# Patient Record
Sex: Male | Born: 1945 | Race: White | Hispanic: No | Marital: Married | Smoking: Current every day smoker
Health system: Southern US, Community
[De-identification: ages and names within clinical notes are randomized; demographics above are authoritative.]

## PROBLEM LIST (undated history)

## (undated) DIAGNOSIS — Z720 Tobacco use: Secondary | ICD-10-CM

## (undated) DIAGNOSIS — R0602 Shortness of breath: Secondary | ICD-10-CM

## (undated) DIAGNOSIS — G8929 Other chronic pain: Secondary | ICD-10-CM

## (undated) DIAGNOSIS — M25559 Pain in unspecified hip: Secondary | ICD-10-CM

## (undated) DIAGNOSIS — D649 Anemia, unspecified: Secondary | ICD-10-CM

## (undated) DIAGNOSIS — J449 Chronic obstructive pulmonary disease, unspecified: Secondary | ICD-10-CM

## (undated) DIAGNOSIS — E7800 Pure hypercholesterolemia, unspecified: Secondary | ICD-10-CM

## (undated) DIAGNOSIS — M1990 Unspecified osteoarthritis, unspecified site: Secondary | ICD-10-CM

## (undated) DIAGNOSIS — I10 Essential (primary) hypertension: Secondary | ICD-10-CM

## (undated) HISTORY — DX: Pure hypercholesterolemia, unspecified: E78.00

## (undated) HISTORY — DX: Pain in unspecified hip: M25.559

## (undated) HISTORY — PX: HIP SURGERY: SHX245

## (undated) HISTORY — PX: OTHER SURGICAL HISTORY: SHX169

## (undated) HISTORY — DX: Essential (primary) hypertension: I10

---

## 2004-11-29 ENCOUNTER — Emergency Department (HOSPITAL_COMMUNITY): Admission: EM | Admit: 2004-11-29 | Discharge: 2004-11-29 | Payer: Self-pay | Admitting: Emergency Medicine

## 2004-12-02 ENCOUNTER — Emergency Department (HOSPITAL_COMMUNITY): Admission: EM | Admit: 2004-12-02 | Discharge: 2004-12-02 | Payer: Self-pay | Admitting: Emergency Medicine

## 2004-12-27 ENCOUNTER — Ambulatory Visit (HOSPITAL_COMMUNITY): Admission: RE | Admit: 2004-12-27 | Discharge: 2004-12-27 | Payer: Self-pay | Admitting: Family Medicine

## 2005-02-08 ENCOUNTER — Ambulatory Visit (HOSPITAL_COMMUNITY): Admission: RE | Admit: 2005-02-08 | Discharge: 2005-02-08 | Payer: Self-pay | Admitting: Family Medicine

## 2005-08-24 ENCOUNTER — Encounter (HOSPITAL_COMMUNITY): Admission: RE | Admit: 2005-08-24 | Discharge: 2005-09-23 | Payer: Self-pay | Admitting: Oncology

## 2005-08-24 ENCOUNTER — Ambulatory Visit (HOSPITAL_COMMUNITY): Payer: Self-pay | Admitting: Oncology

## 2005-08-24 ENCOUNTER — Encounter: Admission: RE | Admit: 2005-08-24 | Discharge: 2005-08-24 | Payer: Self-pay | Admitting: Oncology

## 2006-01-02 ENCOUNTER — Ambulatory Visit (HOSPITAL_COMMUNITY): Payer: Self-pay | Admitting: Oncology

## 2006-01-02 ENCOUNTER — Encounter: Admission: RE | Admit: 2006-01-02 | Discharge: 2006-01-02 | Payer: Self-pay | Admitting: Oncology

## 2006-01-02 ENCOUNTER — Encounter (HOSPITAL_COMMUNITY): Admission: RE | Admit: 2006-01-02 | Discharge: 2006-02-01 | Payer: Self-pay | Admitting: Oncology

## 2006-03-08 ENCOUNTER — Encounter: Admission: RE | Admit: 2006-03-08 | Discharge: 2006-03-11 | Payer: Self-pay | Admitting: Oncology

## 2006-03-08 ENCOUNTER — Ambulatory Visit (HOSPITAL_COMMUNITY): Payer: Self-pay | Admitting: Oncology

## 2006-03-08 ENCOUNTER — Encounter (HOSPITAL_COMMUNITY): Admission: RE | Admit: 2006-03-08 | Discharge: 2006-03-11 | Payer: Self-pay | Admitting: Oncology

## 2006-03-14 ENCOUNTER — Encounter (HOSPITAL_COMMUNITY): Admission: RE | Admit: 2006-03-14 | Discharge: 2006-04-13 | Payer: Self-pay | Admitting: Oncology

## 2006-03-14 ENCOUNTER — Encounter: Admission: RE | Admit: 2006-03-14 | Discharge: 2006-03-14 | Payer: Self-pay | Admitting: Oncology

## 2006-09-04 ENCOUNTER — Encounter (HOSPITAL_COMMUNITY): Admission: RE | Admit: 2006-09-04 | Discharge: 2006-10-04 | Payer: Self-pay | Admitting: Oncology

## 2006-09-04 ENCOUNTER — Ambulatory Visit (HOSPITAL_COMMUNITY): Payer: Self-pay | Admitting: Oncology

## 2007-03-19 ENCOUNTER — Ambulatory Visit (HOSPITAL_COMMUNITY): Payer: Self-pay | Admitting: Oncology

## 2007-03-19 ENCOUNTER — Encounter (HOSPITAL_COMMUNITY): Admission: RE | Admit: 2007-03-19 | Discharge: 2007-04-18 | Payer: Self-pay | Admitting: Oncology

## 2011-03-24 LAB — CBC
Hemoglobin: 14.8
MCHC: 34.4
MCV: 95.8
RBC: 4.48
RDW: 13.2

## 2011-03-24 LAB — PSA: PSA: 0.89

## 2011-06-14 HISTORY — PX: JOINT REPLACEMENT: SHX530

## 2011-12-20 ENCOUNTER — Ambulatory Visit (HOSPITAL_COMMUNITY)
Admission: RE | Admit: 2011-12-20 | Discharge: 2011-12-20 | Disposition: A | Discharge status: Home / Self Care | Payer: Medicare Other | Source: Ambulatory Visit | Attending: Family Medicine | Admitting: Family Medicine

## 2011-12-20 ENCOUNTER — Other Ambulatory Visit (HOSPITAL_COMMUNITY): Payer: Self-pay | Admitting: Family Medicine

## 2011-12-20 DIAGNOSIS — M1990 Unspecified osteoarthritis, unspecified site: Secondary | ICD-10-CM

## 2011-12-20 DIAGNOSIS — M1610 Unilateral primary osteoarthritis, unspecified hip: Secondary | ICD-10-CM | POA: Insufficient documentation

## 2011-12-20 DIAGNOSIS — R937 Abnormal findings on diagnostic imaging of other parts of musculoskeletal system: Secondary | ICD-10-CM | POA: Insufficient documentation

## 2011-12-20 DIAGNOSIS — M25559 Pain in unspecified hip: Secondary | ICD-10-CM | POA: Insufficient documentation

## 2011-12-20 DIAGNOSIS — M169 Osteoarthritis of hip, unspecified: Secondary | ICD-10-CM | POA: Insufficient documentation

## 2011-12-20 LAB — CBC
AST: 13 U/L
Alkaline Phosphatase: 83 U/L
BUN: 16 mg/dL (ref 4–21)
Creat: 1.15
HCT: 45 %
MCHC: 35
MCV: 91.8 fL
PSA: 0.91
Potassium: 4.4 mmol/L
RDW: 13.9

## 2011-12-27 ENCOUNTER — Other Ambulatory Visit (HOSPITAL_COMMUNITY): Payer: Self-pay | Admitting: Family Medicine

## 2011-12-27 DIAGNOSIS — M1990 Unspecified osteoarthritis, unspecified site: Secondary | ICD-10-CM

## 2011-12-30 ENCOUNTER — Ambulatory Visit (HOSPITAL_COMMUNITY): Payer: Medicare Other

## 2011-12-30 ENCOUNTER — Ambulatory Visit (HOSPITAL_COMMUNITY)
Admission: RE | Admit: 2011-12-30 | Discharge: 2011-12-30 | Disposition: A | Discharge status: Home / Self Care | Payer: Medicare Other | Source: Ambulatory Visit | Attending: Family Medicine | Admitting: Family Medicine

## 2011-12-30 DIAGNOSIS — M1990 Unspecified osteoarthritis, unspecified site: Secondary | ICD-10-CM

## 2011-12-30 DIAGNOSIS — M169 Osteoarthritis of hip, unspecified: Secondary | ICD-10-CM | POA: Insufficient documentation

## 2011-12-30 DIAGNOSIS — R937 Abnormal findings on diagnostic imaging of other parts of musculoskeletal system: Secondary | ICD-10-CM | POA: Insufficient documentation

## 2011-12-30 DIAGNOSIS — M87059 Idiopathic aseptic necrosis of unspecified femur: Secondary | ICD-10-CM | POA: Insufficient documentation

## 2011-12-30 DIAGNOSIS — M1610 Unilateral primary osteoarthritis, unspecified hip: Secondary | ICD-10-CM | POA: Insufficient documentation

## 2011-12-30 DIAGNOSIS — M25559 Pain in unspecified hip: Secondary | ICD-10-CM | POA: Insufficient documentation

## 2012-01-25 ENCOUNTER — Encounter: Payer: Self-pay | Admitting: Orthopedic Surgery

## 2012-01-25 ENCOUNTER — Ambulatory Visit (INDEPENDENT_AMBULATORY_CARE_PROVIDER_SITE_OTHER): Payer: Medicare Other | Admitting: Orthopedic Surgery

## 2012-01-25 VITALS — BP 130/70 | Ht 71.0 in | Wt 122.0 lb

## 2012-01-25 DIAGNOSIS — M8700 Idiopathic aseptic necrosis of unspecified bone: Secondary | ICD-10-CM | POA: Insufficient documentation

## 2012-01-25 DIAGNOSIS — M169 Osteoarthritis of hip, unspecified: Secondary | ICD-10-CM

## 2012-01-25 NOTE — Progress Notes (Signed)
Subjective:    Patient ID: Terry Herman, male    DOB: 03-Dec-1945, 66 y.o.   MRN: [id number]  Hip Pain  Incident onset: 6 years pain left hip and right hip left greater than right. There was no injury mechanism. The pain is present in the left hip and right hip. The quality of the pain is described as aching and stabbing. The pain is at a severity of 8/10. The pain has been constant since onset. Associated symptoms comments: Pain sitting and walking. Symptoms started gradually and progressed over the last 6 years left side is worse than right. Current treatment naproxen and Percocet for pain. The patient has not currently drinking he stopped in 1998 but was a heavy drinker prior to that..   The patient indicates that he is having functional deficits and pain requiring heavy pain medication.   Review of Systems Positive findings on review of systems include fatigue blurred vision chest palpitation shortness of breath wheezing cough tightness of the chest heartburn diarrhea joint pain swelling instability stiffness numbness tingling unsteady gait easy bleeding easy bruising and seasonal allergies. Denies frequency skin changes nervousness or excessive thirst    Objective:   Physical Exam  Constitutional: He is oriented to person, place, and time. He appears well-developed and well-nourished.       Severely ectomorphic body habitus  HENT:  Head: Normocephalic.  Eyes: Right eye exhibits no discharge. Left eye exhibits no discharge.  Neck: Neck supple. No JVD present. No tracheal deviation present.  Cardiovascular: Intact distal pulses.   Pulmonary/Chest: Effort normal.  Abdominal: He exhibits no distension.  Musculoskeletal:       Upper extremity exam  Inspection and palpation revealed no abnormalities in the upper extremities.  Range of motion is full without contracture.  Motor exam is normal with grade 5 strength.  The joints are fully reduced without subluxation.  There is no atrophy  or tremor and muscle tone is normal.  All joints are stable.       Lymphadenopathy:    He has no cervical adenopathy.  Neurological: He is alert and oriented to person, place, and time. He has normal reflexes. He exhibits normal muscle tone. Coordination normal.  Skin: Skin is warm and dry.  Psychiatric: He has a normal mood and affect. His behavior is normal. Judgment and thought content normal.   Right Hip Exam   Tenderness  The patient is experiencing no tenderness.     Range of Motion  Extension: abnormal  Flexion: abnormal  Internal Rotation: abnormal  External Rotation: abnormal  Abduction: abnormal  Adduction: abnormal   Muscle Strength  The patient has normal right hip strength.  Other  Erythema: absent Scars: absent Sensation: normal Pulse: present  Comments:  Right lower extremity decreased range of motion with tight hip flexor. Decreased abduction. Flexion past 90.OBLIGATORY external rotation with hip flexion. Decreased internal rotation and painful range of motion.   Left Hip Exam   Tenderness  The patient is experiencing no tenderness.     Range of Motion  Extension: abnormal  Flexion: abnormal  Internal Rotation: abnormal  External Rotation: abnormal  Abduction: abnormal  Adduction: abnormal   Muscle Strength  The patient has normal left hip strength.   Other  Erythema: absent Scars: absent Sensation: normal Pulse: present  Comments:  Left hip flexion past 90 of OBLIGATORY external rotation with hip flexion, decreased abduction. Tight hip flexor. Painful range of motion.     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 I've given him some information regarding hip replacement he will read it come back in 3 weeks to schedule and discuss surgery.

## 2012-01-25 NOTE — Patient Instructions (Addendum)
Total Hip Replacement In total hip replacement, the damaged hip is replaced with an artificial hip joint (prosthesis). The purpose of this surgery is to reduce pain and improve your range of motion. It is one of the most successful joint replacement surgeries. LET YOUR CAREGIVER KNOW ABOUT:    Allergies.   Medicines taken, including herbs, eyedrops, over-the-counter medicines, and creams.   Use of steroids (by mouth or creams).   Previous problems with anesthetics.   Family history of anesthetic problems.   Possibility of pregnancy, if this applies.   History of blood clots (thrombophlebitis).   History of bleeding or blood problems.   Previous surgery.   Other health problems.  BEFORE THE PROCEDURE    Do not eat or drink anything for as long as directed by your caregiver before surgery.   You should be present 60 minutes before your procedure or as directed by your caregiver.  PROCEDURE An intravenous (IV) line for giving fluids will be started. You will be given medicines and gas to make you sleep (anesthetic), or you will be given medicines through a needle in your back to make you numb from the waist down. Your surgeon will take out any damaged cartilage and bone. He or she will then put in new metal, plastic, or ceramic joint surfaces to restore alignment and function to your hip. AFTER THE PROCEDURE   After the procedure, you will be taken to the recovery area where a nurse will watch and check your progress. You may have a long, narrow tube (catheter) in your bladder after surgery. The catheter helps you empty your bladder (pass your urine). Once you are awake, stable, and taking fluids well, you will be returned to your room. You will receive physical therapy until you are doing well and your caregiver feels it is safe for you to go home. If you do not have help at home, you may need to go to an extended care facility for 5 to 14 days after the procedure. Document Released:  09/05/2000 Document Revised: 05/19/2011 Document Reviewed: 04/01/2009 [facility] Patient Information 2012 [place], [place].Preparing for Hip Replacement Surgery Recovery from hip replacement surgery can be made easier and more comfortable by taking steps to be prepared before surgery. This includes:    Arranging for others to help you.   Preparing your home.   Making sure your body is prepared by doing a pre-operative exam and being as healthy as you can.   Doing exercises before your surgery as told by your caregiver.  Also, you can ease any concerns about your financial responsibilities by calling your insurance company after you decide to have surgery. In addition to your surgery and hospital stay, you will want to ask about your coverage for medical equipment, rehab facilities, and home care. ARRANGING FOR HELP   You will be getting stronger and more mobile every day. However, in the first couple weeks after surgery, it is unlikely you will be able to do all your daily activities as easily as before your surgery. You will tire easily and still have limited movement in your leg. Follow these guidelines to best arrange for the help you may need after your surgery:    Arrange for someone to drive you home from the hospital.  Your surgeon will be able to tell you how many days you can expect to be in the hospital.   Cancel all work, care-giving, and volunteer responsibilities for at least 4 to 6 weeks after your surgery.  If you live alone, arrange for someone to care for your home and pets for the first 4 to 6 weeks.   Select someone with whom you feel comfortable to be with you day and night for the first week. This person will help you with your exercises and personal care, like bathing and using the toilet.   Arrange for drivers to bring you to and from your doctor and therapy appointments, as well as to the grocery store and other places you need to go, for at least 4 to 6 weeks.   Select  2 or 3 rehab facilities where you would be comfortable recovering just in case you are not able to go directly home to recover.  PREPARING YOUR HOME    Remove all clutter from your floors.   To see if you will be able to move in these spaces with a wheeled walker, hold your hands out about 6 inches from your sides. Then walk from your bed to the bathroom. Walk from your resting spot to your kitchen and bathroom. If you do not hit anything with your hands, you probably have enough room.   Remove throw rugs.   Move the items you use often to shelves and drawers that are at countertop height. Move items in your bathroom, kitchen, and bedroom.   Prepare a few meals that you can freeze and reheat later.   Do not plan on recovering in bed. It is better for your health to sit upright.  You may wish to use a recliner with a small table nearby. Keep the items you use most frequently on that table. These may include the TV remote, a cordless phone, a book or laptop computer, water glass, and any other items of your choice.   Consider adding grab bars in the shower and near the toilet.   While you are in the hospital, you will learn about equipment helpful for your recovery. Some of the equipment includes raised toilet seats, tub benches, and shower benches. Often, your hospital care team will help you decide what you need and can direct you about where to buy these items. You may not need all of these items, and they are not often returnable, so it is not recommended that you buy them before going to the hospital.  PREPARING YOUR BODY    Complete a pre-operative exam. This will ensure that your body is healthy enough to safely complete this surgery. Be certain to bring a complete list of all your medicines and supplements (herbs and vitamins). You may be advised to have additional tests to ensure your safety.   Complete elective dental care and routine cleanings before the surgery. Germs from anywhere in  your body, including those in your mouth, can travel to your new joint and infect it. It is important not to have any dental work performed for at least 3 months after your surgery. After surgery, be sure to tell your dentist about your joint replacement.   Maintain a healthy diet. Unless advised by your surgeon, do not drastically change your diet before your surgery.   Quit smoking. Get help from your caregiver if you need it.   The day before your surgery, follow your surgeon's directions for showering, eating, and drinking. These directions are for your safety.  EXERCISES Your caregiver may have you do the following exercises before your surgery. Be sure to follow the exercise program your caregiver prescribes for you. Doing the exercises on both sides will  help prepare your "good" side as well. While completing these exercises, remember:    Stretch as long as you can, up to 30 seconds, without pain developing.   You should only feel a gentle lengthening or release in the stretched tissue.  Ankle Pumps 1. While sitting with your knees straight, draw the top of your feet upwards by flexing your ankles.   2. Then, reverse the motion, pointing your toes downward.  3. Repeat 10 to 20 times. Complete this exercise 1 to 2 times per day.  Heel Slides 1. Lie on your back with both knees straight. (If this causes back discomfort, bend your opposite knee, placing your foot flat on the floor.)  2. Slowly slide your heel back toward your buttocks until you feel a gentle stretch in the front of your knee or thigh.  3. Slowly slide your heel back to the starting position.  4. Repeat 10 to 20 times. Complete this exercise 1 to 2 times per day.  Quadriceps Sets 1. Lie on your back with your sore leg extended and your opposite knee bent.  2. Gradually tense the muscles in the front of your thigh. You should see either your kneecap slide up toward your hip or increased dimpling just above the knee. This  motion will push the back of the knee down toward the floor.  3. Hold the muscle as tight as you can without increasing your pain for 10 seconds.  4. Relax the muscles slowly and completely in between each repetition.  5. Repeat 10 to 20 times. Complete this exercise 1 to 2 times per day.  Short Arc Kicks  1. Lie on your back. Place a 4 to 6 inch towel roll under your sore knee so that the knee slightly bends.  2. Raise only your lower leg by tightening the muscles in the front of your thigh. Do not allow your thigh to rise.  3. Hold this position for 5 seconds.  4. Repeat 10 to 20 times. Complete this exercise 1 to 2 times per day.  Straight Leg Raises 1. Lie on your back with your sore leg extended and your opposite knee bent.   2. Tense the muscles in the front of your sore thigh. You should see either your kneecap slide up or increased dimpling just above the knee. Your thigh may even quiver.  3. Tighten these muscles even more and raise your leg 4 to 6 inches off the floor. Hold for 3 to 5 seconds.  4. Keeping these muscles tense, lower your leg.  5. Relax the muscles slowly and completely in between each repetition.  6. Repeat 10 to 20 times. Complete this exercise 1 to 2 times per day.  Arm Chair Push-ups 1. Find a firm, non-wheeled chair with solid armrests.  2. Sitting in the chair, extend your sore leg straight out in front of you.  3. Lift up your body weight, using your arms and opposite leg.  4. Slowly lower your body weight.   5. Repeat 10 to 20 times. Complete this exercise 1 to 2 times per day.  Document Released: 09/03/2010 Document Revised: 05/19/2011 Document Reviewed: 09/03/2010 [facility] Patient Information 2012 [place], [place].

## 2012-02-15 ENCOUNTER — Encounter: Payer: Self-pay | Admitting: Orthopedic Surgery

## 2012-02-15 ENCOUNTER — Ambulatory Visit (INDEPENDENT_AMBULATORY_CARE_PROVIDER_SITE_OTHER): Payer: Medicare Other | Admitting: Orthopedic Surgery

## 2012-02-15 VITALS — BP 110/50 | Ht 71.0 in | Wt 122.0 lb

## 2012-02-15 DIAGNOSIS — M8700 Idiopathic aseptic necrosis of unspecified bone: Secondary | ICD-10-CM

## 2012-02-15 NOTE — Progress Notes (Signed)
Patient ID: Terry Herman, male   DOB: 1945-11-25, 66 y.o.   MRN: [id number] Chief Complaint  Patient presents with  . Follow-up    follow up hip pain, left greater than right, discuss surgery   Surgery will proceed as planned with a LEFT total hip arthroplasty.  However, he will need some dental work done prior to surgery. He will call us and I will speak to the [facility] regarding when we can proceed with his hip replacement surgery.  He recently went to the doctor for the 1st time.  He is on some blood pressure medicine and a cholesterol, tablet, as well as some pain medication.  He is probably going to need a colonoscopy or prostate specific antigen for screening tests. He is also complaining of lack of appetite.  I will correspond with his primary care physician  Time 15 min   Surgical questions answered.

## 2012-02-15 NOTE — Patient Instructions (Signed)
Call us after Dental work

## 2012-02-16 ENCOUNTER — Telehealth: Payer: Self-pay | Admitting: Orthopedic Surgery

## 2012-02-16 NOTE — Telephone Encounter (Signed)
Terry Herman will be seeing his dentist, Dr. Marcelle Smiling in [place] next Tuesday at 1:00 Dr. Hillard Danker phone # is [phone]

## 2012-03-14 ENCOUNTER — Telehealth: Payer: Self-pay | Admitting: Orthopedic Surgery

## 2012-03-14 NOTE — Telephone Encounter (Signed)
Patient (wife, Eber Jones) called to relay that patient has seen Dr. Freddi Starr, dentist, and has had teeth removed by Dr. Freddi Starr, [place], ph [phone].  Please advise patient, has questions regarding surgery, and whether patient is to return for visit here, or what next step is.  Patient ph# is [phone].

## 2012-03-14 NOTE — Telephone Encounter (Signed)
Has dentist ok'd for surgery if so   Schedule preop visit here  for sugery

## 2012-03-20 ENCOUNTER — Encounter: Payer: Self-pay | Admitting: Orthopedic Surgery

## 2012-03-20 ENCOUNTER — Ambulatory Visit (INDEPENDENT_AMBULATORY_CARE_PROVIDER_SITE_OTHER): Payer: Medicare Other | Admitting: Orthopedic Surgery

## 2012-03-20 VITALS — BP 90/60 | Ht 71.0 in | Wt 122.0 lb

## 2012-03-20 DIAGNOSIS — M8700 Idiopathic aseptic necrosis of unspecified bone: Secondary | ICD-10-CM

## 2012-03-20 DIAGNOSIS — M169 Osteoarthritis of hip, unspecified: Secondary | ICD-10-CM

## 2012-03-20 NOTE — Patient Instructions (Addendum)
You have been scheduled for total hip arthroplasty please stop all blood thinners one week prior to surgery  The risks and benefits of the procedure include but are not limited to wound infection, postoperative bleeding, operative the vein thrombosis or blood clot. Pulmonary embolus which can be fatal. Dislocation of the hip. Pain in the hip. Early hip failure due to excessive wear. The average time for recovery is 3-5 months.   Consult Dr Darrick Penna Colonoscopy

## 2012-03-20 NOTE — Progress Notes (Signed)
Patient ID: Terry Herman, male   DOB: Nov 18, 1945, 66 y.o.   MRN: [id number] Chief Complaint  Patient presents with  . Follow-up    discuss left THA    The patient has had his request to dental work.  However, he is still not been worked up for weight loss. He has not had a colonoscopy. He has not had a prostate-specific antigen test.  I am sending him to the GI specialist to be worked up for colonoscopy. We are delaying his surgery until November. Also get a calcium level, and albumin level a PSA level as part of his preop workup.  We will place a LEFT total hip replacement with a [employer] system   And remaining portions of the history and physical be dictated. A time closer to surgery  No past medical history on file.  No past surgical history on file.  Family History  Problem Relation Age of Onset  . Arthritis    . Cancer    . Diabetes     Current Outpatient Prescriptions on File Prior to Visit  Medication Sig Dispense Refill  . lisinopril-hydrochlorothiazide (PRINZIDE,ZESTORETIC) 20-12.5 MG per tablet       . naproxen (NAPROSYN) 500 MG tablet       . oxyCODONE-acetaminophen (PERCOCET/ROXICET) 5-325 MG per tablet       . pravastatin (PRAVACHOL) 40 MG tablet

## 2012-03-22 ENCOUNTER — Telehealth: Payer: Self-pay | Admitting: *Deleted

## 2012-03-22 ENCOUNTER — Encounter (HOSPITAL_COMMUNITY): Payer: Self-pay | Admitting: Pharmacy Technician

## 2012-03-22 NOTE — Telephone Encounter (Signed)
Referral has been sent to [facility] for colonoscopy consult

## 2012-03-26 ENCOUNTER — Telehealth: Payer: Self-pay | Admitting: *Deleted

## 2012-03-26 NOTE — Telephone Encounter (Signed)
Referral sent 

## 2012-04-02 ENCOUNTER — Ambulatory Visit (INDEPENDENT_AMBULATORY_CARE_PROVIDER_SITE_OTHER): Payer: Medicare Other | Admitting: Gastroenterology

## 2012-04-02 ENCOUNTER — Encounter: Payer: Self-pay | Admitting: Gastroenterology

## 2012-04-02 VITALS — BP 113/64 | HR 90 | Temp 97.9°F | Ht 71.0 in | Wt 119.2 lb

## 2012-04-02 DIAGNOSIS — R634 Abnormal weight loss: Secondary | ICD-10-CM

## 2012-04-02 DIAGNOSIS — Z1211 Encounter for screening for malignant neoplasm of colon: Secondary | ICD-10-CM

## 2012-04-02 NOTE — Patient Instructions (Addendum)
We have set you up for a colonoscopy and upper endoscopy with Dr. Fields in the near future.  Further recommendations to follow.    

## 2012-04-02 NOTE — Progress Notes (Addendum)
Primary Care Physician:  Isabella Stalling, MD Primary Gastroenterologist:  Dr. Darrick Penna   Chief Complaint  Patient presents with  . weight loss    HPI:   66 year old male referred by Dr. Romeo Apple for evaluation prior to hip surgery. +wt loss. States wt ranges from 125-127, whole life. Now 119. Lost appetite when started taking "all that medicine". Started in July. Placed on BP, cholesterol meds. Aleve daily for past 3-5 years. States he had no insurance for several years, unable to qualify for Medicare. Recently had teeth pulled. No dysphagia. No abdominal pain. Occasional nausea. Notes recently dealing with constipation. "new". Usually has BM every day, feels like sometimes "harder" than normal. No hematochezia or melena.   No prior colonoscopy. No prior upper endoscopy. States he gives blood several times a year and not ever told he was anemic.   Outside labs from July 2013 reviewed. Hgb 15.8, Plts 368, BUN, Cr normal. LFTs normal. PSA normal. TSH 1.156, normal.   Past Medical History  Diagnosis Date  . Hypertension   . Hypercholesterolemia   . Hip pain     Scheduled for hip replacement in Nov 2013 with Dr. Romeo Apple    Past Surgical History  Procedure Date  . None     Current Outpatient Prescriptions  Medication Sig Dispense Refill  . lisinopril-hydrochlorothiazide (PRINZIDE,ZESTORETIC) 20-12.5 MG per tablet Take 1 tablet by mouth daily.       . naproxen (NAPROSYN) 500 MG tablet Take 500 mg by mouth 2 (two) times daily.       Marland Kitchen oxyCODONE-acetaminophen (PERCOCET/ROXICET) 5-325 MG per tablet Take 1 tablet by mouth every 8 (eight) hours as needed. Pain.      . pravastatin (PRAVACHOL) 40 MG tablet Take 40 mg by mouth daily.         Allergies as of 04/02/2012  . (No Known Allergies)    Family History  Problem Relation Age of Onset  . Arthritis    . Cancer    . Diabetes    . Colon cancer Neg Hx     History   Social History  . Marital Status: Married    Spouse Name:  N/A    Number of Children: N/A  . Years of Education: N/A   Occupational History  . Not on file.   Social History Main Topics  . Smoking status: Current Every Day Smoker -- 1.0 packs/day    Types: Cigarettes  . Smokeless tobacco: Not on file  . Alcohol Use: No     No ETOH in past 5 years, was previously an alcoholic.   . Drug Use: No  . Sexually Active: Not on file   Other Topics Concern  . Not on file   Social History Narrative  . No narrative on file    Review of Systems: Gen: SEE HPI CV: Denies chest pain, heart palpitations, peripheral edema, syncope.  Resp: Denies shortness of breath at rest or with exertion. Denies wheezing or cough.  GI: Denies dysphagia or odynophagia. Denies jaundice, hematemesis, fecal incontinence. GU : Denies urinary burning, urinary frequency, urinary hesitancy MS: Denies joint pain, muscle weakness, cramps, or limitation of movement.  Derm: Denies rash, itching, dry skin Psych: Denies depression, anxiety, memory loss, and confusion Heme: Denies bruising, bleeding, and enlarged lymph nodes.  Physical Exam: BP 113/64  Pulse 90  Temp 97.9 F (36.6 C) (Temporal)  Ht 5\' 11"  (1.803 m)  Wt 119 lb 3.2 oz (54.069 kg)  BMI 16.63 kg/m2 General:   Alert and  oriented. Pleasant and cooperative. Well-nourished and well-developed.  Head:  Normocephalic and atraumatic. Eyes:  Without icterus, sclera clear and conjunctiva pink.  Ears:  Normal auditory acuity. Nose:  No deformity, discharge,  or lesions. Mouth:  No deformity or lesions, oral mucosa pink.  Neck:  Supple, without mass or thyromegaly. Lungs:  Scattered rhonchi bilaterally.  Heart:  S1, S2 present without murmurs appreciated.  Abdomen:  +BS, soft, non-tender and non-distended. No HSM noted. No guarding or rebound. No masses appreciated.  Rectal:  Deferred  Msk:  Symmetrical without gross deformities. Normal posture. Extremities:  Without clubbing or edema. Neurologic:  Alert and   oriented x4;  grossly normal neurologically. Skin:  Intact without significant lesions or rashes. Cervical Nodes:  No significant cervical adenopathy. Psych:  Alert and cooperative. Normal mood and affect.

## 2012-04-04 DIAGNOSIS — Z1211 Encounter for screening for malignant neoplasm of colon: Secondary | ICD-10-CM | POA: Insufficient documentation

## 2012-04-04 DIAGNOSIS — R634 Abnormal weight loss: Secondary | ICD-10-CM | POA: Insufficient documentation

## 2012-04-04 NOTE — Progress Notes (Signed)
Faxed to PCP

## 2012-04-04 NOTE — Assessment & Plan Note (Addendum)
66 year old male with no prior colonoscopy, wt loss of 6-8 lbs in past several months, unintentional. Currently weights 119. No signs of overt GI bleeding, Hgb in July 2013 normal. TSH normal. Notes "harder" stools than normal. Feels lack of appetite began when new medication started in July. Occasional nausea. Dr. Romeo Apple is requesting GI evaluation prior to hip surgery in early November. Will proceed with a colonoscopy, possible upper endoscopy in the near future. Per Dr. Darrick Penna, may do without Propofol (hx of ETOH abuse but none in 5 years).    Proceed with colonoscopy+/-upper endoscopy with Dr. Darrick Penna in the near future. The risks, benefits, and alternatives have been discussed in detail with the patient. They state understanding and desire to proceed.

## 2012-04-05 ENCOUNTER — Telehealth: Payer: Self-pay | Admitting: Orthopedic Surgery

## 2012-04-05 NOTE — Telephone Encounter (Signed)
Regarding surgery scheduled at [facility], in-patient, 04/16/12, for CPT 27130, total hip arthroplasty, ICD9 diagnoses 733.40, 715.95, 715.15, contacted [employer], ph# [phone].  Per Iantha Fallen, received pre-authorization # [id number], status pending.  Nurse reviewer will contact our office with additional information. ** Follow up by 04/09/12 to check status.

## 2012-04-09 NOTE — Patient Instructions (Addendum)
21 Terry Herman  04/09/2012   Your procedure is scheduled on:  04/16/12  Report to [facility] at 06:15 AM.  Call this number if you have problems the morning of surgery: [phone]   Remember:   Do not eat or drink:After Midnight.  Take these medicines the morning of surgery with A SIP OF WATER: Lisinopril-HCTZ, Naproxen  & Oxycodone only if needed.   Do not wear jewelry, make-up or nail polish.  Do not wear lotions, powders, or perfumes.   Do not shave 48 hours prior to surgery. Men may shave face and neck.  Do not bring valuables to the hospital.  Contacts, dentures or bridgework may not be worn into surgery.  Leave suitcase in the car. After surgery it may be brought to your room.    Patients discharged the day of surgery will not be allowed to drive home.   Special Instructions: Shower using CHG 2 nights before surgery and the night before surgery.  If you shower the day of surgery use CHG.  Use special wash - you have one bottle of CHG for all showers.  You should use approximately 1/3 of the bottle for each shower.   Please read over the following fact sheets that you were given: Pain Booklet, MRSA Information, Surgical Site Infection Prevention, Anesthesia Post-op Instructions and Care and Recovery After Surgery    Total Hip Replacement Total hip replacement is the replacement of your damaged hip with an artificial hip joint (prosthetic hip joint). The purpose of this surgery is to reduce pain and improve your hip function. LET YOUR CAREGIVER KNOW ABOUT:   Any allergies you have.  Any medicines you are taking, including vitamins, herbs, eyedrops, over-the-counter medicines, and creams.  Any problems you have had with the use of anesthetics.  Family history of problems with the use of anesthetics.  Any blood disorders you have, including bleeding problems or clotting problems.  Previous surgeries you have had. RISKS AND COMPLICATIONS Generally, total hip replacement is a  safe procedure. However, as with any surgical procedure, complications can occur. Complications associated with total hip replacement both during and after the procedure include:  Infection.  Dislocation (the ball of the hip-joint prosthesis comes out of contact with the socket).  Loosening of the stem connected to the ball or socket.  Fracture of the bone while inserting the prosthesis.  Formation of blood clots, which can break loose and travel to and injure your lungs (pulmonary embolus). BEFORE THE PROCEDURE   Your caregiver will instruct you when you need to stop eating and drinking.  Ask your caregiver if you need to change or stop any regular medicines. PROCEDURE Just before the procedure, you will receive medicine that will make you drowsy (sedative) or medicine to make you fall asleep (general anesthetic). This will be given through a tube that is inserted into one of your veins (intravenous [IV] tube). Then you will receive medicine to block pain from the waist down through your legs (spinal block). An incision is made in your hip. Your surgeon will take out any damaged cartilage and bone. Next, your surgeon will insert a prosthetic socket into your pelvic bone. This is usually secured with screws. Then, your surgeon will cut off the ball of your thigh bone (femur) and attach a prosthetic ball on a stem to your femur. The surgeon then places the ball into the socket and checks the range of motion of your new hip. AFTER THE PROCEDURE  You will  be taken to the recovery area where a nurse will watch and check your progress. Once you are awake and stable, you will be taken to a hospital room. You will receive physical therapy until you are doing well and your caregiver feels it is safe for you to go home. Typically, you will stay in the hospital 1 4 days after your procedure. Document Released: 09/05/2000 Document Revised: 11/29/2011 Document Reviewed: 07/17/2011 [facility] Patient  Information 2013 [place], [place].    PATIENT INSTRUCTIONS POST-ANESTHESIA  IMMEDIATELY FOLLOWING SURGERY:  Do not drive or operate machinery for the first twenty four hours after surgery.  Do not make any important decisions for twenty four hours after surgery or while taking narcotic pain medications or sedatives.  If you develop intractable nausea and vomiting or a severe headache please notify your doctor immediately.  FOLLOW-UP:  Please make an appointment with your surgeon as instructed. You do not need to follow up with anesthesia unless specifically instructed to do so.  WOUND CARE INSTRUCTIONS (if applicable):  Keep a dry clean dressing on the anesthesia/puncture wound site if there is drainage.  Once the wound has quit draining you may leave it open to air.  Generally you should leave the bandage intact for twenty four hours unless there is drainage.  If the epidural site drains for more than 36-48 hours please call the anesthesia department.  QUESTIONS?:  Please feel free to call your physician or the hospital operator if you have any questions, and they will be happy to assist you.

## 2012-04-09 NOTE — Telephone Encounter (Signed)
04/09/12 Received request from insurer, [employer]'s nurse reviewer Luan Moore, for clinical notes.  Faxed as per request to [phone].

## 2012-04-10 ENCOUNTER — Encounter (HOSPITAL_COMMUNITY): Payer: Self-pay

## 2012-04-10 ENCOUNTER — Other Ambulatory Visit: Payer: Self-pay

## 2012-04-10 ENCOUNTER — Encounter (HOSPITAL_COMMUNITY)
Admission: RE | Admit: 2012-04-10 | Discharge: 2012-04-10 | Disposition: A | Discharge status: Home / Self Care | Payer: Medicare Other | Source: Ambulatory Visit | Attending: Orthopedic Surgery | Admitting: Orthopedic Surgery

## 2012-04-10 HISTORY — DX: Unspecified osteoarthritis, unspecified site: M19.90

## 2012-04-10 HISTORY — DX: Shortness of breath: R06.02

## 2012-04-10 LAB — CBC WITH DIFFERENTIAL/PLATELET
Basophils Absolute: 0.1 10*3/uL (ref 0.0–0.1)
Eosinophils Relative: 2 % (ref 0–5)
HCT: 34.2 % — ABNORMAL LOW (ref 39.0–52.0)
Lymphocytes Relative: 29 % (ref 12–46)
MCV: 94.2 fL (ref 78.0–100.0)
Monocytes Absolute: 0.6 10*3/uL (ref 0.1–1.0)
RDW: 13.8 % (ref 11.5–15.5)
WBC: 7.5 10*3/uL (ref 4.0–10.5)

## 2012-04-10 LAB — BASIC METABOLIC PANEL
BUN: 23 mg/dL (ref 6–23)
CO2: 30 mEq/L (ref 19–32)
Chloride: 97 mEq/L (ref 96–112)
Creatinine, Ser: 1.38 mg/dL — ABNORMAL HIGH (ref 0.50–1.35)

## 2012-04-10 LAB — APTT: aPTT: 32 seconds (ref 24–37)

## 2012-04-10 LAB — SURGICAL PCR SCREEN
MRSA, PCR: NEGATIVE
Staphylococcus aureus: NEGATIVE

## 2012-04-10 LAB — ABO/RH: ABO/RH(D): O POS

## 2012-04-10 NOTE — Progress Notes (Signed)
Pt desires to hold off from pursuing a colonoscopy/EGD until AFTER hip surgery. I have reviewed this with Dr. Romeo Apple. As patient has no evidence of active GI bleeding, he will proceed with hip surgery.  Please have pt follow-up with [place] in about 6-8 weeks to discuss colonoscopy/EGD.

## 2012-04-12 NOTE — H&P (Addendum)
Patient ID: Terry Herman, male DOB: [birth year], 66 y.o. MRN: [id number]  Hip Pain  Incident onset: 6 years pain left hip and right hip left greater than right. There was no injury mechanism. The pain is present in the left hip and right hip. The quality of the pain is described as aching and stabbing. The pain is at a severity of 8/10. The pain has been constant since onset. Associated symptoms comments: Pain sitting and walking. Symptoms started gradually and progressed over the last 6 years left side is worse than right. Current treatment naproxen and Percocet for pain. The patient has not currently drinking he stopped in 1998 but was a heavy drinker prior to that..  The patient indicates that he is having functional deficits and pain requiring heavy pain medication.  Review of Systems  Positive findings on review of systems include fatigue blurred vision chest palpitation shortness of breath wheezing cough tightness of the chest heartburn diarrhea joint pain swelling instability stiffness numbness tingling unsteady gait easy bleeding easy bruising and seasonal allergies. Denies frequency skin changes nervousness or excessive thirst   Past Medical History  Diagnosis Date  . Hypertension   . Hypercholesterolemia   . Hip pain     Scheduled for hip replacement in Nov 2013 with Dr. Romeo Apple  . Shortness of breath     exertion  . Arthritis    Past Surgical History  Procedure Date  . None   . Artificial testicle placed 40 years ago   Family History  Problem Relation Age of Onset  . Arthritis    . Cancer    . Diabetes    . Colon cancer Neg Hx    History   Social History  . Marital Status: Married    Spouse Name: N/A    Number of Children: N/A  . Years of Education: N/A   Occupational History  . Not on file.   Social History Main Topics  . Smoking status: Current Every Day Smoker -- 1.5 packs/day for 50 years    Types: Cigarettes  . Smokeless tobacco: Not on file  . Alcohol Use:  No     Comment: No ETOH in past 5 years, was previously an alcoholic.   . Drug Use: No  . Sexually Active: Not on file   Other Topics Concern  . Not on file   Social History Narrative  . No narrative on file   Objective:   Physical Exam  Constitutional: He is oriented to person, place, and time. He appears well-developed and well-nourished.  Severely ectomorphic body habitus  HENT:  Head: Normocephalic.  Eyes: Right eye exhibits no discharge. Left eye exhibits no discharge.  Neck: Neck supple. No JVD present. No tracheal deviation present.  Cardiovascular: Intact distal pulses.  Pulmonary/Chest: Effort normal.  Abdominal: He exhibits no distension.  Musculoskeletal:  Upper extremity exam  Inspection and palpation revealed no abnormalities in the upper extremities. Range of motion is full without contracture.  Motor exam is normal with grade 5 strength.  The joints are fully reduced without subluxation.  There is no atrophy or tremor and muscle tone is normal. All joints are stable.      Lymphadenopathy:  He has no cervical adenopathy.  Neurological: He is alert and oriented to person, place, and time. He has normal reflexes. He exhibits normal muscle tone. Coordination normal.  Skin: Skin is warm and dry.  Psychiatric: He has a normal mood and affect. His behavior is normal. Judgment and thought  content normal.  Right Hip Exam  Tenderness  The patient is experiencing no tenderness.  Range of Motion  Extension: abnormal  Flexion: abnormal  Internal Rotation: abnormal  External Rotation: abnormal  Abduction: abnormal  Adduction: abnormal  Muscle Strength  The patient has normal right hip strength.  Other  Erythema: absent  Scars: absent  Sensation: normal  Pulse: present  Comments: Right lower extremity decreased range of motion with tight hip flexor. Decreased abduction. Flexion past 90.OBLIGATORY external rotation with hip flexion. Decreased internal  rotation and painful range of motion.  Left Hip Exam  Tenderness  The patient is experiencing no tenderness.  Range of Motion  Extension: abnormal  Flexion: abnormal  Internal Rotation: abnormal  External Rotation: abnormal  Abduction: abnormal  Adduction: abnormal  Muscle Strength  The patient has normal left hip strength.  Other  Erythema: absent  Scars: absent  Sensation: normal  Pulse: present  Comments: Left hip flexion past 90 of OBLIGATORY external rotation with hip flexion, decreased abduction. Tight hip flexor. Painful range of motion.  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

## 2012-04-16 ENCOUNTER — Inpatient Hospital Stay (HOSPITAL_COMMUNITY): Payer: Medicare Other

## 2012-04-16 ENCOUNTER — Encounter (HOSPITAL_COMMUNITY): Payer: Self-pay | Admitting: *Deleted

## 2012-04-16 ENCOUNTER — Encounter (HOSPITAL_COMMUNITY): Payer: Self-pay | Admitting: Anesthesiology

## 2012-04-16 ENCOUNTER — Inpatient Hospital Stay (HOSPITAL_COMMUNITY): Payer: Medicare Other | Admitting: Anesthesiology

## 2012-04-16 ENCOUNTER — Inpatient Hospital Stay (HOSPITAL_COMMUNITY)
Admission: RE | Admit: 2012-04-16 | Discharge: 2012-04-19 | DRG: 470 | Disposition: A | Discharge status: Home Health | Payer: Medicare Other | Source: Ambulatory Visit | Attending: Orthopedic Surgery | Admitting: Orthopedic Surgery

## 2012-04-16 ENCOUNTER — Encounter (HOSPITAL_COMMUNITY)
Admission: RE | Disposition: A | Discharge status: Home Health | Payer: Self-pay | Source: Ambulatory Visit | Attending: Orthopedic Surgery

## 2012-04-16 DIAGNOSIS — J449 Chronic obstructive pulmonary disease, unspecified: Secondary | ICD-10-CM | POA: Diagnosis present

## 2012-04-16 DIAGNOSIS — I10 Essential (primary) hypertension: Secondary | ICD-10-CM | POA: Diagnosis present

## 2012-04-16 DIAGNOSIS — F17200 Nicotine dependence, unspecified, uncomplicated: Secondary | ICD-10-CM | POA: Diagnosis present

## 2012-04-16 DIAGNOSIS — F1021 Alcohol dependence, in remission: Secondary | ICD-10-CM | POA: Diagnosis present

## 2012-04-16 DIAGNOSIS — Z23 Encounter for immunization: Secondary | ICD-10-CM

## 2012-04-16 DIAGNOSIS — M87059 Idiopathic aseptic necrosis of unspecified femur: Principal | ICD-10-CM | POA: Diagnosis present

## 2012-04-16 DIAGNOSIS — M129 Arthropathy, unspecified: Secondary | ICD-10-CM | POA: Diagnosis present

## 2012-04-16 DIAGNOSIS — M8700 Idiopathic aseptic necrosis of unspecified bone: Secondary | ICD-10-CM

## 2012-04-16 DIAGNOSIS — M169 Osteoarthritis of hip, unspecified: Secondary | ICD-10-CM | POA: Diagnosis present

## 2012-04-16 DIAGNOSIS — J4489 Other specified chronic obstructive pulmonary disease: Secondary | ICD-10-CM | POA: Diagnosis present

## 2012-04-16 DIAGNOSIS — E7800 Pure hypercholesterolemia, unspecified: Secondary | ICD-10-CM | POA: Diagnosis present

## 2012-04-16 DIAGNOSIS — D62 Acute posthemorrhagic anemia: Secondary | ICD-10-CM | POA: Diagnosis not present

## 2012-04-16 DIAGNOSIS — R12 Heartburn: Secondary | ICD-10-CM | POA: Diagnosis present

## 2012-04-16 DIAGNOSIS — M1610 Unilateral primary osteoarthritis, unspecified hip: Secondary | ICD-10-CM | POA: Diagnosis present

## 2012-04-16 HISTORY — PX: TOTAL HIP ARTHROPLASTY: SHX124

## 2012-04-16 SURGERY — ARTHROPLASTY, HIP, TOTAL,POSTERIOR APPROACH
Anesthesia: Spinal | Site: Hip | Laterality: Left | Wound class: Clean

## 2012-04-16 MED ORDER — MIDAZOLAM HCL 2 MG/2ML IJ SOLN
INTRAMUSCULAR | Status: AC
  Filled 2012-04-16: qty 2

## 2012-04-16 MED ORDER — HYDROCHLOROTHIAZIDE 12.5 MG PO CAPS
12.5000 mg | ORAL_CAPSULE | Freq: Every day | ORAL | Status: DC
  Administered 2012-04-19: 12.5 mg via ORAL
  Filled 2012-04-16 (×4): qty 1

## 2012-04-16 MED ORDER — OXYCODONE HCL 5 MG PO TABS
5.0000 mg | ORAL_TABLET | Freq: Once | ORAL | Status: AC
  Administered 2012-04-16: 5 mg via ORAL

## 2012-04-16 MED ORDER — HYDROMORPHONE HCL PF 1 MG/ML IJ SOLN
0.5000 mg | INTRAMUSCULAR | Status: DC | PRN
  Administered 2012-04-16 – 2012-04-17 (×2): 0.5 mg via INTRAVENOUS
  Filled 2012-04-16 (×2): qty 1

## 2012-04-16 MED ORDER — EPHEDRINE SULFATE 50 MG/ML IJ SOLN
INTRAMUSCULAR | Status: DC | PRN
  Administered 2012-04-16: 10 mg via INTRAVENOUS
  Administered 2012-04-16: 5 mg via INTRAVENOUS
  Administered 2012-04-16 (×2): 10 mg via INTRAVENOUS

## 2012-04-16 MED ORDER — SODIUM CHLORIDE 0.9 % IV SOLN
INTRAVENOUS | Status: DC
  Administered 2012-04-16 – 2012-04-17 (×2): via INTRAVENOUS

## 2012-04-16 MED ORDER — MENTHOL 3 MG MT LOZG: 1.0000 | LOZENGE | OROMUCOSAL | Status: DC | PRN

## 2012-04-16 MED ORDER — CHLORHEXIDINE GLUCONATE 4 % EX LIQD: 60.0000 mL | Freq: Once | CUTANEOUS | Status: DC

## 2012-04-16 MED ORDER — DOCUSATE SODIUM 100 MG PO CAPS
100.0000 mg | ORAL_CAPSULE | Freq: Two times a day (BID) | ORAL | Status: DC
  Administered 2012-04-16 – 2012-04-19 (×7): 100 mg via ORAL
  Filled 2012-04-16 (×7): qty 1

## 2012-04-16 MED ORDER — METOCLOPRAMIDE HCL 5 MG/ML IJ SOLN
5.0000 mg | Freq: Three times a day (TID) | INTRAMUSCULAR | Status: DC | PRN
  Administered 2012-04-17: 10 mg via INTRAVENOUS
  Filled 2012-04-16: qty 2

## 2012-04-16 MED ORDER — MAGNESIUM CITRATE PO SOLN: 1.0000 | Freq: Once | ORAL | Status: AC | PRN

## 2012-04-16 MED ORDER — ALBUTEROL SULFATE (5 MG/ML) 0.5% IN NEBU
2.5000 mg | INHALATION_SOLUTION | Freq: Four times a day (QID) | RESPIRATORY_TRACT | Status: DC
  Administered 2012-04-16: 2.5 mg via RESPIRATORY_TRACT
  Filled 2012-04-16: qty 0.5

## 2012-04-16 MED ORDER — SODIUM CHLORIDE 0.9 % IJ SOLN
INTRAMUSCULAR | Status: AC
  Filled 2012-04-16: qty 3

## 2012-04-16 MED ORDER — PHENOL 1.4 % MT LIQD: 1.0000 | OROMUCOSAL | Status: DC | PRN

## 2012-04-16 MED ORDER — METHOCARBAMOL 100 MG/ML IJ SOLN
500.0000 mg | Freq: Once | INTRAVENOUS | Status: AC
  Administered 2012-04-16: 500 mg via INTRAVENOUS
  Filled 2012-04-16: qty 5

## 2012-04-16 MED ORDER — LACTATED RINGERS IV SOLN
INTRAVENOUS | Status: DC
  Administered 2012-04-16: 50 mL via INTRAVENOUS
  Administered 2012-04-16: 09:00:00 via INTRAVENOUS

## 2012-04-16 MED ORDER — BUPIVACAINE IN DEXTROSE 0.75-8.25 % IT SOLN
INTRATHECAL | Status: AC
  Filled 2012-04-16: qty 2

## 2012-04-16 MED ORDER — PREGABALIN 50 MG PO CAPS
50.0000 mg | ORAL_CAPSULE | Freq: Once | ORAL | Status: AC
  Administered 2012-04-16: 50 mg via ORAL

## 2012-04-16 MED ORDER — PROPOFOL 10 MG/ML IV EMUL
INTRAVENOUS | Status: AC
  Filled 2012-04-16: qty 20

## 2012-04-16 MED ORDER — BISACODYL 5 MG PO TBEC
5.0000 mg | DELAYED_RELEASE_TABLET | Freq: Every day | ORAL | Status: DC | PRN
  Administered 2012-04-19: 5 mg via ORAL
  Filled 2012-04-16: qty 1

## 2012-04-16 MED ORDER — PNEUMOCOCCAL VAC POLYVALENT 25 MCG/0.5ML IJ INJ
0.5000 mL | INJECTION | INTRAMUSCULAR | Status: AC
  Administered 2012-04-17: 0.5 mL via INTRAMUSCULAR
  Filled 2012-04-16: qty 0.5

## 2012-04-16 MED ORDER — CEFAZOLIN SODIUM 1-5 GM-% IV SOLN
1.0000 g | Freq: Four times a day (QID) | INTRAVENOUS | Status: AC
  Administered 2012-04-16 (×2): 1 g via INTRAVENOUS
  Filled 2012-04-16 (×4): qty 50

## 2012-04-16 MED ORDER — LISINOPRIL 10 MG PO TABS
20.0000 mg | ORAL_TABLET | Freq: Every day | ORAL | Status: DC
  Filled 2012-04-16 (×4): qty 2

## 2012-04-16 MED ORDER — LISINOPRIL-HYDROCHLOROTHIAZIDE 20-12.5 MG PO TABS: 1.0000 | ORAL_TABLET | Freq: Every day | ORAL | Status: DC

## 2012-04-16 MED ORDER — MIDAZOLAM HCL 5 MG/5ML IJ SOLN
INTRAMUSCULAR | Status: DC | PRN
  Administered 2012-04-16: 2 mg via INTRAVENOUS
  Administered 2012-04-16 (×2): 1 mg via INTRAVENOUS

## 2012-04-16 MED ORDER — CELECOXIB 100 MG PO CAPS
200.0000 mg | ORAL_CAPSULE | Freq: Two times a day (BID) | ORAL | Status: DC
  Administered 2012-04-16 – 2012-04-19 (×6): 200 mg via ORAL
  Filled 2012-04-16 (×6): qty 2

## 2012-04-16 MED ORDER — ONDANSETRON HCL 4 MG/2ML IJ SOLN
INTRAMUSCULAR | Status: AC
  Filled 2012-04-16: qty 2

## 2012-04-16 MED ORDER — ACETAMINOPHEN 10 MG/ML IV SOLN
1000.0000 mg | Freq: Once | INTRAVENOUS | Status: AC
  Administered 2012-04-16: 1000 mg via INTRAVENOUS

## 2012-04-16 MED ORDER — MIDAZOLAM HCL 2 MG/2ML IJ SOLN
1.0000 mg | INTRAMUSCULAR | Status: DC | PRN
  Administered 2012-04-16: 2 mg via INTRAVENOUS

## 2012-04-16 MED ORDER — PREGABALIN 50 MG PO CAPS
ORAL_CAPSULE | ORAL | Status: AC
  Filled 2012-04-16: qty 1

## 2012-04-16 MED ORDER — PROPOFOL INFUSION 10 MG/ML OPTIME
INTRAVENOUS | Status: DC | PRN
  Administered 2012-04-16: 25 ug/kg/min via INTRAVENOUS

## 2012-04-16 MED ORDER — SODIUM CHLORIDE 0.9 % IR SOLN
Status: DC | PRN
  Administered 2012-04-16 (×3): 1000 mL

## 2012-04-16 MED ORDER — BUPIVACAINE-EPINEPHRINE PF 0.5-1:200000 % IJ SOLN
INTRAMUSCULAR | Status: AC
  Filled 2012-04-16: qty 30

## 2012-04-16 MED ORDER — ALUM & MAG HYDROXIDE-SIMETH 200-200-20 MG/5ML PO SUSP
30.0000 mL | ORAL | Status: DC | PRN
  Administered 2012-04-17: 30 mL via ORAL
  Filled 2012-04-16: qty 30

## 2012-04-16 MED ORDER — BUPIVACAINE IN DEXTROSE 0.75-8.25 % IT SOLN
INTRATHECAL | Status: DC | PRN
  Administered 2012-04-16: 15 mg via INTRATHECAL

## 2012-04-16 MED ORDER — OXYCODONE HCL 5 MG PO TABS
ORAL_TABLET | ORAL | Status: AC
  Filled 2012-04-16: qty 1

## 2012-04-16 MED ORDER — ACETAMINOPHEN 10 MG/ML IV SOLN
INTRAVENOUS | Status: AC
  Filled 2012-04-16: qty 100

## 2012-04-16 MED ORDER — INFLUENZA VIRUS VACC SPLIT PF IM SUSP
0.5000 mL | INTRAMUSCULAR | Status: AC
  Administered 2012-04-17: 0.5 mL via INTRAMUSCULAR
  Filled 2012-04-16: qty 0.5

## 2012-04-16 MED ORDER — ONDANSETRON HCL 4 MG/2ML IJ SOLN
4.0000 mg | Freq: Once | INTRAMUSCULAR | Status: AC | PRN
  Administered 2012-04-16: 4 mg via INTRAVENOUS

## 2012-04-16 MED ORDER — CEFAZOLIN SODIUM-DEXTROSE 2-3 GM-% IV SOLR
2.0000 g | INTRAVENOUS | Status: AC
  Administered 2012-04-16: 2 g via INTRAVENOUS

## 2012-04-16 MED ORDER — OXYCODONE HCL 5 MG PO TABS
5.0000 mg | ORAL_TABLET | ORAL | Status: DC
  Administered 2012-04-16 – 2012-04-18 (×9): 5 mg via ORAL
  Filled 2012-04-16 (×9): qty 1

## 2012-04-16 MED ORDER — STERILE WATER FOR IRRIGATION IR SOLN
Status: DC | PRN
  Administered 2012-04-16: 5000 mL

## 2012-04-16 MED ORDER — CELECOXIB 100 MG PO CAPS
ORAL_CAPSULE | ORAL | Status: AC
  Filled 2012-04-16: qty 4

## 2012-04-16 MED ORDER — FENTANYL CITRATE 0.05 MG/ML IJ SOLN
INTRAMUSCULAR | Status: AC
  Filled 2012-04-16: qty 2

## 2012-04-16 MED ORDER — ONDANSETRON HCL 4 MG/2ML IJ SOLN: 4.0000 mg | Freq: Once | INTRAMUSCULAR | Status: DC

## 2012-04-16 MED ORDER — DIPHENHYDRAMINE HCL 12.5 MG/5ML PO ELIX: 12.5000 mg | ORAL_SOLUTION | ORAL | Status: DC | PRN

## 2012-04-16 MED ORDER — CEFAZOLIN SODIUM-DEXTROSE 2-3 GM-% IV SOLR
INTRAVENOUS | Status: AC
  Filled 2012-04-16: qty 50

## 2012-04-16 MED ORDER — ACETAMINOPHEN 10 MG/ML IV SOLN
1000.0000 mg | Freq: Four times a day (QID) | INTRAVENOUS | Status: AC
  Administered 2012-04-16 – 2012-04-17 (×3): 1000 mg via INTRAVENOUS
  Filled 2012-04-16 (×6): qty 100

## 2012-04-16 MED ORDER — ALBUTEROL SULFATE (5 MG/ML) 0.5% IN NEBU: 2.5000 mg | INHALATION_SOLUTION | RESPIRATORY_TRACT | Status: DC | PRN

## 2012-04-16 MED ORDER — FENTANYL CITRATE 0.05 MG/ML IJ SOLN: 25.0000 ug | INTRAMUSCULAR | Status: DC | PRN

## 2012-04-16 MED ORDER — METHOCARBAMOL 500 MG PO TABS
500.0000 mg | ORAL_TABLET | Freq: Four times a day (QID) | ORAL | Status: DC | PRN
  Administered 2012-04-19: 500 mg via ORAL
  Filled 2012-04-16: qty 1

## 2012-04-16 MED ORDER — SENNOSIDES-DOCUSATE SODIUM 8.6-50 MG PO TABS
1.0000 | ORAL_TABLET | Freq: Every evening | ORAL | Status: DC | PRN
  Administered 2012-04-17: 1 via ORAL
  Filled 2012-04-16: qty 1

## 2012-04-16 MED ORDER — METOCLOPRAMIDE HCL 10 MG PO TABS
5.0000 mg | ORAL_TABLET | Freq: Three times a day (TID) | ORAL | Status: DC | PRN
  Administered 2012-04-17: 10 mg via ORAL
  Filled 2012-04-16: qty 2

## 2012-04-16 MED ORDER — ONDANSETRON HCL 4 MG/2ML IJ SOLN: 4.0000 mg | Freq: Four times a day (QID) | INTRAMUSCULAR | Status: DC | PRN

## 2012-04-16 MED ORDER — SENNA 8.6 MG PO TABS
1.0000 | ORAL_TABLET | Freq: Two times a day (BID) | ORAL | Status: DC
  Administered 2012-04-16 – 2012-04-19 (×6): 8.6 mg via ORAL
  Filled 2012-04-16 (×6): qty 1

## 2012-04-16 MED ORDER — EPINEPHRINE HCL 1 MG/ML IJ SOLN
INTRAMUSCULAR | Status: DC | PRN
  Administered 2012-04-16: .1 mL via INTRATRACHEAL

## 2012-04-16 MED ORDER — ENOXAPARIN SODIUM 30 MG/0.3ML ~~LOC~~ SOLN
30.0000 mg | Freq: Two times a day (BID) | SUBCUTANEOUS | Status: DC
  Administered 2012-04-17 (×2): 30 mg via SUBCUTANEOUS
  Filled 2012-04-16 (×2): qty 0.3

## 2012-04-16 MED ORDER — ONDANSETRON HCL 4 MG PO TABS
4.0000 mg | ORAL_TABLET | Freq: Four times a day (QID) | ORAL | Status: DC | PRN
  Administered 2012-04-17: 4 mg via ORAL
  Filled 2012-04-16: qty 1

## 2012-04-16 MED ORDER — CELECOXIB 100 MG PO CAPS
400.0000 mg | ORAL_CAPSULE | Freq: Once | ORAL | Status: AC
  Administered 2012-04-16: 400 mg via ORAL

## 2012-04-16 MED ORDER — METHOCARBAMOL 100 MG/ML IJ SOLN
500.0000 mg | Freq: Four times a day (QID) | INTRAVENOUS | Status: DC | PRN
  Filled 2012-04-16: qty 5

## 2012-04-16 MED ORDER — FENTANYL CITRATE 0.05 MG/ML IJ SOLN
INTRAMUSCULAR | Status: DC | PRN
  Administered 2012-04-16: 30 ug via INTRAVENOUS
  Administered 2012-04-16: 50 ug via INTRAVENOUS
  Administered 2012-04-16: 20 ug via INTRATHECAL

## 2012-04-16 MED ORDER — LIDOCAINE HCL (PF) 1 % IJ SOLN
INTRAMUSCULAR | Status: AC
  Filled 2012-04-16: qty 5

## 2012-04-16 MED ORDER — NICOTINE 21 MG/24HR TD PT24
21.0000 mg | MEDICATED_PATCH | Freq: Every day | TRANSDERMAL | Status: DC
  Administered 2012-04-16 – 2012-04-17 (×2): 21 mg via TRANSDERMAL
  Filled 2012-04-16 (×2): qty 1

## 2012-04-16 MED ORDER — SIMVASTATIN 20 MG PO TABS
20.0000 mg | ORAL_TABLET | Freq: Every day | ORAL | Status: DC
  Administered 2012-04-16 – 2012-04-18 (×3): 20 mg via ORAL
  Filled 2012-04-16 (×3): qty 1

## 2012-04-16 MED ORDER — LACTATED RINGERS IV SOLN
INTRAVENOUS | Status: DC
Start: 2012-04-16 — End: 2012-04-16

## 2012-04-16 MED ORDER — BUPIVACAINE-EPINEPHRINE PF 0.5-1:200000 % IJ SOLN
INTRAMUSCULAR | Status: DC | PRN
  Administered 2012-04-16: 90 mL

## 2012-04-16 SURGICAL SUPPLY — 67 items
BIT DRILL 2.8X128 (BIT) ×2 IMPLANT
BLADE HEX COATED 2.75 (ELECTRODE) ×2 IMPLANT
BLADE SAGITTAL 25.0X1.27X90 (BLADE) ×2 IMPLANT
BRUSH FEMORAL CANAL (MISCELLANEOUS) IMPLANT
CATH KIT ON Q 2.5IN SLV (PAIN MANAGEMENT) IMPLANT
CHLORAPREP W/TINT 26ML (MISCELLANEOUS) ×4 IMPLANT
CLOTH BEACON ORANGE TIMEOUT ST (SAFETY) ×2 IMPLANT
COVER LIGHT HANDLE STERIS (MISCELLANEOUS) ×4 IMPLANT
COVER PROBE W GEL 5X96 (DRAPES) ×2 IMPLANT
DECANTER SPIKE VIAL GLASS SM (MISCELLANEOUS) ×5 IMPLANT
DRAPE BACK TABLE (DRAPES) ×2 IMPLANT
DRAPE HIP W/POCKET STRL (DRAPE) ×2 IMPLANT
DRAPE INCISE IOBAN 44X35 STRL (DRAPES) ×2 IMPLANT
DRAPE U-SHAPE 47X51 STRL (DRAPES) ×2 IMPLANT
DRSG MEPILEX BORDER 4X12 (GAUZE/BANDAGES/DRESSINGS) ×2 IMPLANT
ELECT REM PT RETURN 9FT ADLT (ELECTROSURGICAL) ×4
ELECTRODE REM PT RTRN 9FT ADLT (ELECTROSURGICAL) ×1 IMPLANT
FACESHIELD OPICON STD (MASK) ×2 IMPLANT
GLOVE BIOGEL PI IND STRL 7.5 (GLOVE) IMPLANT
GLOVE BIOGEL PI INDICATOR 7.5 (GLOVE) ×1
GLOVE ECLIPSE 7.0 STRL STRAW (GLOVE) ×1 IMPLANT
GLOVE EXAM NITRILE MD LF STRL (GLOVE) ×3 IMPLANT
GLOVE INDICATOR 7.0 STRL GRN (GLOVE) ×3 IMPLANT
GLOVE INDICATOR 7.5 STRL GRN (GLOVE) ×1 IMPLANT
GLOVE OPTIFIT SS 8.0 STRL (GLOVE) ×1 IMPLANT
GLOVE SKINSENSE NS SZ8.0 LF (GLOVE) ×1
GLOVE SKINSENSE STRL SZ8.0 LF (GLOVE) ×2 IMPLANT
GLOVE SS BIOGEL STRL SZ 6.5 (GLOVE) IMPLANT
GLOVE SS N UNI LF 8.5 STRL (GLOVE) ×2 IMPLANT
GLOVE SUPERSENSE BIOGEL SZ 6.5 (GLOVE) ×3
GOWN STRL REIN XL XLG (GOWN DISPOSABLE) ×7 IMPLANT
HANDPIECE INTERPULSE COAX TIP (DISPOSABLE)
HOOD W/PEELAWAY (MISCELLANEOUS) ×8 IMPLANT
INST SET MAJOR BONE (KITS) ×2 IMPLANT
IV NS IRRIG 3000ML ARTHROMATIC (IV SOLUTION) ×1 IMPLANT
KIT BLADEGUARD II DBL (SET/KITS/TRAYS/PACK) ×2 IMPLANT
KIT ROOM TURNOVER APOR (KITS) ×2 IMPLANT
MANIFOLD NEPTUNE II (INSTRUMENTS) ×2 IMPLANT
MARKER SKIN DUAL TIP RULER LAB (MISCELLANEOUS) ×2 IMPLANT
NDL HYPO 21X1.5 SAFETY (NEEDLE) ×1 IMPLANT
NEEDLE HYPO 21X1.5 SAFETY (NEEDLE) ×2 IMPLANT
NS IRRIG 1000ML POUR BTL (IV SOLUTION) ×4 IMPLANT
PACK TOTAL JOINT (CUSTOM PROCEDURE TRAY) ×2 IMPLANT
PAD ARMBOARD 7.5X6 YLW CONV (MISCELLANEOUS) ×2 IMPLANT
PASSER SUT SWANSON 36MM LOOP (INSTRUMENTS) ×1 IMPLANT
PILLOW HIP ABDUCTION LRG (ORTHOPEDIC SUPPLIES) IMPLANT
PILLOW HIP ABDUCTION MED (ORTHOPEDIC SUPPLIES) ×1 IMPLANT
PIN STMN 9X.142 IN (PIN) ×2 IMPLANT
PIN STMN 9X.157 IN (PIN) ×2 IMPLANT
SET BASIN LINEN APH (SET/KITS/TRAYS/PACK) ×2 IMPLANT
SET HNDPC FAN SPRY TIP SCT (DISPOSABLE) ×1 IMPLANT
SPONGE LAP 18X18 X RAY DECT (DISPOSABLE) ×2 IMPLANT
STAPLER VISISTAT 35W (STAPLE) ×2 IMPLANT
SUT BRALON NAB BRD #1 30IN (SUTURE) ×4 IMPLANT
SUT ETHIBOND 5 LR DA (SUTURE) ×4 IMPLANT
SUT MNCRL 0 VIOLET CTX 36 (SUTURE) ×1 IMPLANT
SUT MON AB 2-0 CT1 36 (SUTURE) ×2 IMPLANT
SUT MONOCRYL 0 CTX 36 (SUTURE) ×1
SUT VIC AB 1 CT1 27 (SUTURE) ×4
SUT VIC AB 1 CT1 27XBRD ANTBC (SUTURE) ×2 IMPLANT
SYR 30ML LL (SYRINGE) ×3 IMPLANT
SYR BULB IRRIGATION 50ML (SYRINGE) ×2 IMPLANT
TOWEL OR 17X26 4PK STRL BLUE (TOWEL DISPOSABLE) ×2 IMPLANT
TOWER CARTRIDGE SMART MIX (DISPOSABLE) IMPLANT
TRAY FOLEY CATH 14FR (SET/KITS/TRAYS/PACK) ×2 IMPLANT
WATER STERILE IRR 1000ML POUR (IV SOLUTION) ×5 IMPLANT
YANKAUER SUCT 12FT TUBE ARGYLE (SUCTIONS) ×2 IMPLANT

## 2012-04-16 NOTE — Anesthesia Procedure Notes (Signed)
Spinal  Patient location during procedure: OR Start time: 04/16/2012 7:56 AM Preanesthetic Checklist Completed: patient identified, site marked, surgical consent, pre-op evaluation, timeout performed, IV checked, risks and benefits discussed and monitors and equipment checked Spinal Block Patient position: left lateral decubitus Prep: Betadine Patient monitoring: heart rate, cardiac monitor, continuous pulse ox and blood pressure Approach: left paramedian Location: L3-4 Injection technique: single-shot Needle Needle type: Spinocan  Needle gauge: 22 G Needle length: 9 cm Assessment Sensory level: T8 Additional Notes  ATTEMPTS:1 TRAY BJ:[id number] TRAY EXPIRATION DATE:05/2013

## 2012-04-16 NOTE — Interval H&P Note (Signed)
History and Physical Interval Note:  04/16/2012 7:21 AM  Terry Herman  has presented today for surgery, with the diagnosis of left hip osteoarthritis  The various methods of treatment have been discussed with the patient and family. After consideration of risks, benefits and other options for treatment, the patient has consented to  Procedure(s) (LRB) with comments: TOTAL HIP ARTHROPLASTY (Left) as a surgical intervention .  The patient's history has been reviewed, patient examined, no change in status, stable for surgery.  I have reviewed the patient's chart and labs.  Questions were answered to the patient's satisfaction.    LEFT THA  Fuller Canada

## 2012-04-16 NOTE — Addendum Note (Signed)
Addendum  created 04/16/12 1039 by Moshe Salisbury, CRNA   Modules edited:Anesthesia Flowsheet

## 2012-04-16 NOTE — Anesthesia Postprocedure Evaluation (Signed)
  Anesthesia Post-op Note  Patient: Terry Herman  Procedure(s) Performed: Procedure(s) (LRB) with comments: TOTAL HIP ARTHROPLASTY (Left)  Patient Location: PACU  Anesthesia Type:Spinal  Level of Consciousness: awake, alert  and oriented  Airway and Oxygen Therapy: Patient Spontanous Breathing and Patient connected to nasal cannula oxygen  Post-op Pain: none  Post-op Assessment: Post-op Vital signs reviewed, Patient's Cardiovascular Status Stable, Respiratory Function Stable, Patent Airway and No signs of Nausea or vomiting  Post-op Vital Signs: Reviewed and stable  Complications: No apparent anesthesia complications

## 2012-04-16 NOTE — Plan of Care (Signed)
Problem: Phase I Progression Outcomes Goal: Initial discharge plan identified Outcome: Progressing Plan to d/c home with home health

## 2012-04-16 NOTE — Evaluation (Addendum)
Physical Therapy Evaluation Patient Details Name: VICTORIA EUCEDA MRN: [id number] DOB: [birth year] Today's Date: 04/16/2012 Time: 4098-1191 PT Time Calculation (min): 28 min  PT Assessment / Plan / Recommendation Clinical Impression  Pt s/p THR who is very independent.  Pt will benefit from skilled therapy while in hospital to improve activity tolerance and safety of ambulation with walker as well as strairclimbing.    PT Assessment  Patient needs continued PT servicesPt with decreased mobility who will benefit from acute PT to improve I and safety.    Follow Up Recommendations  Home health PT    Does the patient have the potential to tolerate intense rehabilitation    N/A  Barriers to Discharge  none      Equipment Recommendations  Rolling walker with 5" wheels;Tub/shower bench    Recommendations for Other Services   OT  Frequency 7X/week    Precautions / Restrictions Precautions Precautions: None         Mobility  Bed Mobility Bed Mobility: Supine to Sit;Sitting - Scoot to [place] of Bed;Sit to Supine Supine to Sit: 4: Min assist;HOB elevated Sitting - Scoot to Edge of Bed: 6: Modified independent (Device/Increase time) Sit to Supine: 4: Min assist Transfers Transfers: Sit to Stand Sit to Stand: 5: Supervision Ambulation/Gait Ambulation/Gait Assistance: 4: Min guard Ambulation Distance (Feet): 10 Feet Assistive device: Rolling walker Gait Pattern: Decreased step length - right Gait velocity: slow Stairs: No       Exercises Total Joint Exercises Ankle Circles/Pumps: AROM;Both;10 reps Quad Sets: AROM;Both;10 reps Gluteal Sets: AROM;Both;10 reps   PT Diagnosis: Difficulty walking;Generalized weakness;Acute paindecreased mobility PT Problem List: Decreased strength;Decreased activity tolerance;Decreased balance;Pain;Decreased knowledge of precautions;Decreased mobilitydifficulty walking PT Treatment Interventions:   ex, gt train, stair training.  PT Goals Acute  Rehab PT Goals PT Goal Formulation: With patient Time For Goal Achievement: 04/18/12 Potential to Achieve Goals: Good Pt will go Supine/Side to Sit: with modified independence PT Goal: Supine/Side to Sit - Progress: Goal set today Pt will go Sit to Stand: with modified independence PT Goal: Sit to Stand - Progress: Goal set today Pt will Ambulate: 51 - 150 feet;with modified independence;with rolling walker PT Goal: Ambulate - Progress: Goal set today Pt will Go Up / Down Stairs: 3-5 stairs;with min assist;with rail(s) PT Goal: Up/Down Stairs - Progress: Goal set today Pt will Perform Home Exercise Program: with supervision, verbal cues required/provided PT Goal: Perform Home Exercise Program - Progress: Goal set today  Visit Information  Last PT Received On: 04/16/12    Subjective Data  Subjective: Pt states that his wife will be home to help him.  He has just gotten pain meds so he is doing well. Patient Stated Goal: To go home.   Prior Functioning  Home Living Lives With: Spouse Available Help at Discharge: Family Type of Home: Mobile home Home Access: Stairs to enter Secretary/administrator of Steps: 5 Entrance Stairs-Rails: Right Home Layout: One level Bathroom Shower/Tub: Engineer, manufacturing systems: Standard Bathroom Accessibility: Yes Home Adaptive Equipment: Straight cane Prior Function Level of Independence: Independent Able to Take Stairs?: Reciprically Driving: Yes Vocation: Retired Musician: No difficulties    Cognition  Overall Cognitive Status: Appears within functional limits for tasks assessed/performed Arousal/Alertness: Awake/alert Orientation Level: Appears intact for tasks assessed Behavior During Session: [facility] for tasks performed    Extremity/Trunk Assessment Right Upper Extremity Assessment RUE ROM/Strength/Tone: Within functional levels Right Lower Extremity Assessment RLE ROM/Strength/Tone: [facility] for tasks assessed Left  Lower Extremity Assessment LLE  ROM/Strength/Tone: [facility] for tasks assessed   Balance    End of Session PT - End of Session Equipment Utilized During Treatment: Gait belt Activity Tolerance: Patient tolerated treatment well Patient left: in bed;with call bell/phone within reach;with family/visitor present  GP     RUSSELL,CINDY 04/16/2012, 4:20 PM

## 2012-04-16 NOTE — Addendum Note (Signed)
Addendum  created 04/16/12 1039 by Shylin Keizer E Ciel Chervenak, CRNA   Modules edited:Anesthesia Flowsheet    

## 2012-04-16 NOTE — Brief Op Note (Addendum)
04/16/2012  10:13 AM  PATIENT:  Terry Herman  66 y.o. male  PRE-OPERATIVE DIAGNOSIS:  left hip osteoarthritis  POST-OPERATIVE DIAGNOSIS:  left hip osteoarthritis  PROCEDURE:  Procedure(s) (LRB) with comments: TOTAL HIP ARTHROPLASTY (Left)  Stryker: tritanium/58 cup/ 127 Degree-size 6 accolade II stem/ 36 , 0 degree trident X3 poly/ 36 LFit anatomic +0 head   SURGEON:  Surgeon(s) and Role:    * Vickki Hearing, MD - Primary  PHYSICIAN ASSISTANT:   ASSISTANTS: betty ashley and debi dallas   ANESTHESIA:   spinal  EBL:  Total I/O In: 1200 [I.V.:1200] Out: 400 [Urine:200; Blood:200]  BLOOD ADMINISTERED:none  DRAINS: none   LOCAL MEDICATIONS USED:  MARCAINE   , Amount: 90 ml and OTHER epi  SPECIMEN:  No Specimen  DISPOSITION OF SPECIMEN:  N/A  COUNTS:  YES  TOURNIQUET:  * No tourniquets in log *  DICTATION: .Dragon Dictation  PLAN OF CARE: Admit to inpatient   PATIENT DISPOSITION:  PACU - hemodynamically stable.   Delay start of Pharmacological VTE agent (>24hrs) due to surgical blood loss or risk of bleeding: yes

## 2012-04-16 NOTE — Op Note (Signed)
04/16/2012  10:13 AM  PATIENT:  Terry Herman  66 y.o. male  PRE-OPERATIVE DIAGNOSIS:  left hip osteoarthritis  POST-OPERATIVE DIAGNOSIS:  left hip osteoarthritis  FINDINGS: OA HIP AND FEMORAL HEAD DEFORMITY AND ACETABULAR EROSION   Details of procedure:  The patient was identified in the preop area and the left hip was marked as a surgical site after confirmation with the patient. The chart was updated. Consent was reviewed.  The patient was then taken to the operating room for spinal anesthesia and then placed left side up right-side-down with an axillary roll under the right axilla.  The patient was placed in a hip positioner. The left lower extremity was prepped and draped with sterile technique.  The timeout procedure was executed  The incision was made over the greater trochanter in line with the femur and gently curved posteriorly. The subcutaneous tissue was divided down to the fascia. Hemostasis was obtained with electrocautery and retractors were placed.  The fascia was split in line with the skin incision exposing the abductors and the greater trochanter bursa. The bursa was excised. The anterior half of the abductor musculature and tendon were removed from the greater trochanter in subperiosteal fashion exposing the gluteus minimus which was tagged and retracted proximally and held in place with Steinmann pins. The abductors were removed from the trochanter in continuity with the vastus lateralis fascia. The anterior lateral arterial branch was then cauterized.  A capsulectomy was performed and the hip was dislocated. The femoral head was removed and then a femoral neck cutting guide was placed and the lesser trochanter was referenced to make a 1.5 cm cut above the lesser trochanter. The femur was then broached up to a size 6.  The acetabulum was exposed by removing the capsule remnants and labrum. Anterior and posterior retractors were placed. The acetabulum was reamed with a  44 and a 45 mm reamer to reach the inner table. Then anatomic reamings referencing the transverse acetabular ligament were performed up to a size 57. A 56 trial liner was placed and bottomed out. A 58 titanium cup was then placed with no screws. A trial size 0-36 femoral head was placed on the trial stem and the hip was trialed with these components in place. Leg lengths were equal. Shuck test was normal. Flexion and internal rotation was 90/60. Extension +5 with 60 external rotation. There was no impingement.  The trial liners and trial implants were removed. The hole illuminator was then placed. The polyethylene was placed. Sutures were passed through the femur and the stem was then placed with a 36 mm head, 0 mm length.  The hip was taken through range of motion again. The hip was stable as noted in the trial.  The wound was irrigated and the abductors were closed with #1 Bralon and #5 Tycron sutures which had been passed to bone. The hip joint was injected with 30 cc of Marcaine with epinephrine  The wound is irrigated again. The fascial layer was closed with a running #1 Bralon suture. The subfascial layer was injected with 30 cc of Marcaine with epinephrine.  The wound was reirrigated and then closed with 0 Monocryl and skin edges reapproximated with staples. Subcutaneous tissue was injected with 30 cc of Marcaine with epinephrine  Sterile dressing was applied.  The patient was taken back to the recovery room on a regular bed. An abduction pillow was placed in leg lengths were confirmed to be equal in the supine position. PROCEDURE:  Procedure(s) (  LRB) with comments: TOTAL HIP ARTHROPLASTY (Left)  Stryker: CUP: tritanium/58 cup/  STEM:127 Degree-size 6 accolade II stem/  POLY: 36 , 0 degree trident X3 poly/  HEAD: 36 LFit anatomic +0 head   SURGEON:  Surgeon(s) and Role:    * Vickki Hearing, MD - Primary  PHYSICIAN ASSISTANT:   ASSISTANTS: betty ashley and debi dallas    ANESTHESIA:   spinal  EBL:  Total I/O In: 1200 [I.V.:1200] Out: 400 [Urine:200; Blood:200]  BLOOD ADMINISTERED:none  DRAINS: none   LOCAL MEDICATIONS USED:  MARCAINE   , Amount: 90 ml and OTHER epi  SPECIMEN:  No Specimen  DISPOSITION OF SPECIMEN:  N/A  COUNTS:  YES  TOURNIQUET:  * No tourniquets in log *  DICTATION: .Dragon Dictation  PLAN OF CARE: Admit to inpatient   PATIENT DISPOSITION:  PACU - hemodynamically stable.   Delay start of Pharmacological VTE agent (>24hrs) due to surgical blood loss or risk of bleeding: yes

## 2012-04-16 NOTE — Transfer of Care (Signed)
Immediate Anesthesia Transfer of Care Note  Patient: Terry Herman  Procedure(s) Performed: Procedure(s) (LRB) with comments: TOTAL HIP ARTHROPLASTY (Left)  Patient Location: PACU  Anesthesia Type:Spinal  Level of Consciousness: awake, alert  and oriented  Airway & Oxygen Therapy: Patient Spontanous Breathing and Patient connected to nasal cannula oxygen  Post-op Assessment: Report given to PACU RN  Post vital signs: Reviewed  Complications: No apparent anesthesia complications

## 2012-04-16 NOTE — Anesthesia Preprocedure Evaluation (Signed)
Anesthesia Evaluation  Patient identified by MRN, date of birth, ID band Patient awake    Reviewed: Allergy & Precautions, H&P , NPO status , Patient's Chart, lab work & pertinent test results  Airway Mallampati: I      Dental  (+) Edentulous Upper and Edentulous Lower   Pulmonary shortness of breath and with exertion, COPDCurrent Smoker (75 PY),  + rhonchi         Cardiovascular hypertension, Pt. on medications Rhythm:Regular Rate:Normal     Neuro/Psych    GI/Hepatic negative GI ROS,   Endo/Other    Renal/GU      Musculoskeletal   Abdominal   Peds  Hematology   Anesthesia Other Findings   Reproductive/Obstetrics                           Anesthesia Physical Anesthesia Plan  ASA: III  Anesthesia Plan: Spinal   Post-op Pain Management:    Induction: Intravenous  Airway Management Planned: Nasal Cannula  Additional Equipment:   Intra-op Plan:   Post-operative Plan:   Informed Consent: I have reviewed the patients History and Physical, chart, labs and discussed the procedure including the risks, benefits and alternatives for the proposed anesthesia with the patient or authorized representative who has indicated his/her understanding and acceptance.     Plan Discussed with:   Anesthesia Plan Comments:         Anesthesia Quick Evaluation  

## 2012-04-17 ENCOUNTER — Telehealth: Payer: Self-pay | Admitting: Orthopedic Surgery

## 2012-04-17 LAB — CBC
Hemoglobin: 7.7 g/dL — ABNORMAL LOW (ref 13.0–17.0)
MCHC: 34.2 g/dL (ref 30.0–36.0)
Platelets: 229 10*3/uL (ref 150–400)
RBC: 2.36 MIL/uL — ABNORMAL LOW (ref 4.22–5.81)

## 2012-04-17 LAB — BASIC METABOLIC PANEL
BUN: 23 mg/dL (ref 6–23)
CO2: 30 mEq/L (ref 19–32)
Calcium: 8.5 mg/dL (ref 8.4–10.5)
Chloride: 97 mEq/L (ref 96–112)
Creatinine, Ser: 1.52 mg/dL — ABNORMAL HIGH (ref 0.50–1.35)
GFR calc Af Amer: 54 mL/min — ABNORMAL LOW (ref >90)

## 2012-04-17 MED ORDER — PANTOPRAZOLE SODIUM 40 MG PO TBEC
40.0000 mg | DELAYED_RELEASE_TABLET | Freq: Two times a day (BID) | ORAL | Status: DC
  Administered 2012-04-17 – 2012-04-19 (×4): 40 mg via ORAL
  Filled 2012-04-17 (×4): qty 1

## 2012-04-17 MED ORDER — ZOLPIDEM TARTRATE 5 MG PO TABS
5.0000 mg | ORAL_TABLET | Freq: Every evening | ORAL | Status: DC | PRN
  Administered 2012-04-17: 5 mg via ORAL
  Filled 2012-04-17: qty 1

## 2012-04-17 MED ORDER — SODIUM CHLORIDE 0.9 % IJ SOLN
INTRAMUSCULAR | Status: AC
  Administered 2012-04-17: 23:00:00
  Filled 2012-04-17: qty 6

## 2012-04-17 NOTE — Addendum Note (Signed)
Addendum  created 04/17/12 1404 by Nedim Oki J Xan Sparkman, CRNA   Modules edited:Notes Section    

## 2012-04-17 NOTE — Progress Notes (Signed)
Subjective: 1 Day Post-Op Procedure(s) (LRB): TOTAL HIP ARTHROPLASTY (Left) Patient reports pain as 2-5.    Objective: Vital signs in last 24 hours: Temp:  [97.3 F (36.3 C)-98.1 F (36.7 C)] 97.4 F (36.3 C) (11/05 0513) Pulse Rate:  [67-103] 88  (11/05 0513) Resp:  [16-26] 20  (11/05 0513) BP: (83-131)/(33-65) 83/44 mmHg (11/05 0513) SpO2:  [94 %-100 %] 94 % (11/05 0513) Weight:  [122 lb 9.6 oz (55.611 kg)] 122 lb 9.6 oz (55.611 kg) (11/04 1816)  Intake/Output from previous day: 11/04 0701 - 11/05 0700 In: 2837.5 [P.O.:480; I.V.:2357.5] Out: 1350 [Urine:1150; Blood:200] Intake/Output this shift:     Basename 04/17/12 0511  HGB 7.7*    Basename 04/17/12 0511  WBC 6.0  RBC 2.36*  HCT 22.5*  PLT 229    Basename 04/17/12 0511  NA 133*  K 4.0  CL 97  CO2 30  BUN 23  CREATININE 1.52*  GLUCOSE 125*  CALCIUM 8.5   No results found for this basename: LABPT:2,INR:2 in the last 72 hours  Neurologically intact Neurovascular intact Sensation intact distally Intact pulses distally Dorsiflexion/Plantar flexion intact  Assessment/Plan: 1 Day Post-Op Procedure(s) (LRB): TOTAL HIP ARTHROPLASTY (Left) Up with therapy D/C IV fluids  Fuller Canada 04/17/2012, 7:12 AM

## 2012-04-17 NOTE — Telephone Encounter (Signed)
Call received from [facility] nurse, [place], [address] question on post-op patient, states heartburn and indigestion, and "seems uncomfortable."  States has given the ordered medications, not much relief as of yet.  Please advise.  Direct ph# [phone].

## 2012-04-17 NOTE — Clinical Documentation Improvement (Signed)
Anemia Blood Loss Clarification  THIS DOCUMENT IS NOT A PERMANENT PART OF THE MEDICAL RECORD  RESPOND TO THE THIS QUERY, FOLLOW THE INSTRUCTIONS BELOW:  1. If needed, update documentation for the patient's encounter via the notes activity.  2. Access this query again and click edit on the In [employer].  3. After updating, or not, click F2 to complete all highlighted (required) fields concerning your review. Select "additional documentation in the medical record" OR "no additional documentation provided".  4. Click Sign note button.  5. The deficiency will fall out of your In Basket *Please let us know if you are not able to complete this workflow by phone or e-mail (listed below).        04/17/12  Dear Dr. Romeo Apple Marton Redwood  In an effort to better capture your patient's severity of illness, reflect appropriate length of stay and utilization of resources, a review of the patient medical record has revealed the following indicators. Based on your clinical judgment, please clarify and document in a progress note and/or discharge summary the clinical condition associated with the following supporting information:  In responding to this query please exercise your independent judgment.  The fact that a query is asked, does not imply that any particular answer is desired or expected.  Possible Clinical Conditions?   " Expected Acute Blood Loss Anemia " Acute Blood Loss Anemia " Acute on chronic blood loss anemia " Chronic blood loss anemia " Precipitous drop in Hematocrit " Other Condition________________ " Cannot Clinically Determine  Clinical Information:   Risk Factors: S/P Total Left hip arthroplasty EBL in OR =  Diagnostics: H&H  10/29=11.9/34.2 11/5=7.7/22.5  Treatments: Transfusion: 2 units PRBC ordered H&H monitoring=CBC qd x 3  Reviewed: additional documentation in the medical record  Thank You,  Debora T Williams RN, MSN Clinical Documentation  Specialist: Office# [phone] [facility] Information Management [place]

## 2012-04-17 NOTE — Progress Notes (Addendum)
Physical Therapy Treatment Patient Details Name: Terry Herman MRN: [id number] DOB: [birth year] Today's Date: 04/18/2012 Time: 1320-1340 PT Time Calculation (min): 20 min  PT Assessment / Plan / Recommendation Comments on Treatment Session  Pt displays increased active hip flexion with heel slides. Pt only has complaint of pain with movement of LLE. Gait mechanics have improved. Pt is without complaint at end. Begin gait training on stairs next session.                      Plan Other (comment) (Continue per PT POC.)           Mobility  Bed Mobility Supine to Sit: 6: Modified independent (Device/Increase time) Sitting - Scoot to Edge of Bed: 6: Modified independent (Device/Increase time) Transfers Sit to Stand: 4: Min guard Stand to Sit: 5: Supervision Details for Transfer Assistance: verbal cueing required today for proper hand placment Ambulation/Gait Ambulation/Gait Assistance: 5: Supervision Ambulation Distance (Feet): 110 Feet Assistive device: Rolling walker Ambulation/Gait Assistance Details: improved gait mechnanics, step-through pattern Gait Pattern: Step-through pattern;Decreased stance time - left    Exercises General Exercises - Lower Extremity Ankle Circles/Pumps: Both;10 reps Quad Sets: Both;10 reps Gluteal Sets: 10 reps;Both Short Arc Quad: Left;10 reps [employer]: 10 reps;Left Heel Slides: Left;10 reps    Visit Information  Last PT Received On: 04/18/12             End of Session PT - End of Session Equipment Utilized During Treatment: Gait belt Activity Tolerance: Patient tolerated treatment well Patient left: in bed;with call bell/phone within reach;with family/visitor present  Seth Bake, PTA 04/18/2012, 1:55 PM

## 2012-04-17 NOTE — Progress Notes (Signed)
UR Chart Review Completed  

## 2012-04-17 NOTE — Progress Notes (Signed)
Physical Therapy Treatment Patient Details Name: Terry Herman MRN: [id number] DOB: 03-29-1946 Today's Date: 04/17/2012 Time: 7829-5621 PT Time Calculation (min): 24 min 1 gt 1 therex  PT Assessment / Plan / Recommendation Comments on Treatment Session  Patient doing very well with all treatment this session. Patient completed 58'of gait training with RW;Min guard; verbal cueing needed for thoracic/cervical extension. Was not able to seat paitent in chair due to family member sound asleep in recliner, will have patient sit up after next treatment this afternoon.    Follow Up Recommendations        Does the patient have the potential to tolerate intense rehabilitation     Barriers to Discharge        Equipment Recommendations       Recommendations for Other Services    Frequency     Plan      Precautions / Restrictions     Pertinent Vitals/Pain     Mobility  Bed Mobility Supine to Sit: 6: Modified independent (Device/Increase time);HOB elevated;With rails Sitting - Scoot to Edge of Bed: 6: Modified independent (Device/Increase time);With rail Sit to Supine: 4: Min assist Details for Bed Mobility Assistance: sit<>supine assistance with LLE needed Transfers Transfers: Stand to Sit Sit to Stand: 5: Supervision Stand to Sit: 6: Modified independent (Device/Increase time) Ambulation/Gait Ambulation Distance (Feet): 58 Feet Assistive device: Rolling walker Gait Pattern: Trunk flexed;Decreased step length - right;Step-through pattern Gait velocity: slow General Gait Details: verbal cues for cervical extension    Exercises General Exercises - Lower Extremity Ankle Circles/Pumps: Both;10 reps Quad Sets: 10 reps;Both Gluteal Sets: 10 reps Short Arc Quad: 10 reps;Both Long Arc Quad: Both;10 reps Heel Slides: Both;10 reps   PT Diagnosis:    PT Problem List:   PT Treatment Interventions:     PT Goals    Visit Information  Last PT Received On: 04/17/12    Subjective  Data      Cognition       Balance     End of Session PT - End of Session Equipment Utilized During Treatment: Gait belt Activity Tolerance: Patient tolerated treatment well Patient left: in bed;with bed alarm set;with call bell/phone within reach;with family/visitor present   GP     Terry Herman 04/17/2012, 10:27 AM

## 2012-04-17 NOTE — Evaluation (Signed)
Occupational Therapy Evaluation Patient Details Name: Terry Herman MRN: [id number] DOB: 03/14/1946 Today's Date: 04/17/2012 Time: 6213-0865 OT Time Calculation (min): 23 min  OT Assessment / Plan / Recommendation Clinical Impression  Patient is a 66 y/o male s/p L THA presenting to acute OT with deficits below. Patient will benefit from OT services to increase ADL performance and functional transfers . Recommend Home health OT at D/C.    OT Assessment  Patient needs continued OT Services    Follow Up Recommendations  Home health OT       Equipment Recommendations  Tub/shower bench;3 in 1 bedside comode       Frequency  Min 2X/week    Precautions / Restrictions Precautions Precautions: Anterior Hip Precaution Comments: handout issued. Restrictions Weight Bearing Restrictions: No   Pertinent Vitals/Pain Patient complained of indigestion. Nursing aware.    ADL  Lower Body Dressing: Maximal assistance;Performed Where Assessed - Lower Body Dressing: Supported sit to stand Toilet Transfer: Performed;Minimal assistance Toilet Transfer Method: Surveyor, minerals:  (to recliner)    OT Diagnosis: Generalized weakness  OT Problem List: Decreased strength;Decreased activity tolerance;Impaired balance (sitting and/or standing);Decreased knowledge of use of DME or AE;Pain OT Treatment Interventions: Self-care/ADL training;Therapeutic exercise;Therapeutic activities;Energy conservation;DME and/or AE instruction;Balance training;Patient/family education   OT Goals Acute Rehab OT Goals OT Goal Formulation: With patient Time For Goal Achievement: 05/01/12 Potential to Achieve Goals: Good ADL Goals Pt Will Perform Upper Body Bathing: with modified independence;Sitting, edge of bed ADL Goal: Upper Body Bathing - Progress: Goal set today Pt Will Perform Lower Body Bathing: with min assist;Supported;Sit to stand from bed ADL Goal: Lower Body Bathing - Progress: Goal  set today Pt Will Perform Upper Body Dressing: with supervision;Sitting, bed ADL Goal: Upper Body Dressing - Progress: Goal set today Pt Will Perform Lower Body Dressing: with min assist;Sit to stand from bed ADL Goal: Lower Body Dressing - Progress: Goal set today Pt Will Transfer to Toilet: with supervision;3-in-1;Ambulation;Stand pivot transfer ADL Goal: Toilet Transfer - Progress: Goal set today Pt Will Perform Toileting - Clothing Manipulation: with supervision;Sitting on 3-in-1 or toilet ADL Goal: Toileting - Clothing Manipulation - Progress: Goal set today Pt Will Perform Toileting - Hygiene: with supervision;Sit to stand from 3-in-1/toilet ADL Goal: Toileting - Hygiene - Progress: Goal set today Arm Goals Pt Will Complete Theraband Exer: with supervision, verbal cues required/provided;to maintain strength;Bilateral upper extremities;2 sets;10 reps;Level 1 Theraband Arm Goal: Theraband Exercises - Progress: Goal set today  Visit Information  Last OT Received On: 04/17/12 Assistance Needed: +1    Subjective Data  Subjective: "I'm not feeling very well today." Patient Stated Goal: To go home.   Prior Functioning     Home Living Lives With: Spouse Available Help at Discharge: Family;Available 24 hours/day Type of Home: Mobile home Home Access: Stairs to enter Entrance Stairs-Number of Steps: 5 Entrance Stairs-Rails: Right Home Layout: One level Bathroom Shower/Tub: Engineer, manufacturing systems: Standard Bathroom Accessibility: Yes Home Adaptive Equipment: Wheelchair - manual;Straight cane;Walker - rolling;Wheelchair - powered;Shower chair with back Prior Function Level of Independence: Independent Able to Take Stairs?: Reciprically Driving: Yes Vocation: Retired Musician: No difficulties Dominant Hand: Right            Cognition  Overall Cognitive Status: Appears within functional limits for tasks assessed/performed Arousal/Alertness:  Awake/alert Orientation Level: Appears intact for tasks assessed Behavior During Session: [facility] for tasks performed    Extremity/Trunk Assessment Right Upper Extremity Assessment RUE ROM/Strength/Tone: Within functional levels (MMT: 4/5)  RUE Sensation: WFL - Light Touch;WFL - Proprioception RUE Coordination: WFL - gross/fine motor Left Upper Extremity Assessment LUE ROM/Strength/Tone: Within functional levels (MMT: 4/5) LUE Sensation: WFL - Light Touch;WFL - Proprioception LUE Coordination: WFL - gross/fine motor                 End of Session OT - End of Session Equipment Utilized During Treatment: Other (comment) (rolling walker) Activity Tolerance: Patient tolerated treatment well Patient left: in chair;with call bell/phone within reach;with family/visitor present     Limmie Patricia, OTR/L 04/17/2012, 4:04 PM

## 2012-04-17 NOTE — Anesthesia Postprocedure Evaluation (Signed)
  Anesthesia Post-op Note  Patient: Terry Herman  Procedure(s) Performed: Procedure(s) (LRB) with comments: TOTAL HIP ARTHROPLASTY (Left)  Patient Location: room 311  Anesthesia Type:Spinal  Level of Consciousness: awake, alert , oriented and patient cooperative  Airway and Oxygen Therapy: Patient Spontanous Breathing  Post-op Pain: 4 /10, mild  Post-op Assessment: Post-op Vital signs reviewed, Patient's Cardiovascular Status Stable, Respiratory Function Stable, Patent Airway, No signs of Nausea or vomiting and Pain level controlled  Post-op Vital Signs: Reviewed and stable  Complications: No apparent anesthesia complications

## 2012-04-17 NOTE — Clinical Social Work Note (Signed)
CSW received consult for possible SNF. PT notes reviewed and recommendation is for home health. CSW signing off but can be reconsulted if needed.  Derenda Fennel, [place] [phone]

## 2012-04-17 NOTE — Telephone Encounter (Signed)
On 04/13/12, per Clayborne Artist, [employer], confirmed that case is approved with same notification/pre-authorization# noted.

## 2012-04-17 NOTE — Progress Notes (Signed)
Foley catheter removed without difficulty, patient given a urinal to use, wife at bedside.

## 2012-04-18 LAB — CBC
HCT: 30.5 % — ABNORMAL LOW (ref 39.0–52.0)
MCHC: 34.1 g/dL (ref 30.0–36.0)
MCV: 91.9 fL (ref 78.0–100.0)
Platelets: 217 10*3/uL (ref 150–400)
RDW: 14.3 % (ref 11.5–15.5)
WBC: 8 10*3/uL (ref 4.0–10.5)

## 2012-04-18 MED ORDER — NICOTINE 7 MG/24HR TD PT24
7.0000 mg | MEDICATED_PATCH | Freq: Every day | TRANSDERMAL | Status: DC
  Administered 2012-04-18 – 2012-04-19 (×2): 7 mg via TRANSDERMAL
  Filled 2012-04-18 (×3): qty 1

## 2012-04-18 MED ORDER — OXYCODONE-ACETAMINOPHEN 5-325 MG PO TABS
1.0000 | ORAL_TABLET | ORAL | Status: DC
  Administered 2012-04-18 – 2012-04-19 (×7): 1 via ORAL
  Filled 2012-04-18 (×7): qty 1

## 2012-04-18 MED ORDER — ENOXAPARIN SODIUM 30 MG/0.3ML ~~LOC~~ SOLN
30.0000 mg | SUBCUTANEOUS | Status: DC
  Administered 2012-04-18: 30 mg via SUBCUTANEOUS
  Filled 2012-04-18: qty 0.3

## 2012-04-18 MED ORDER — SODIUM CHLORIDE 0.9 % IJ SOLN
INTRAMUSCULAR | Status: AC
  Administered 2012-04-18: 07:00:00
  Filled 2012-04-18: qty 3

## 2012-04-18 NOTE — Care Management Note (Signed)
    Page 1 of 2   04/19/2012     2:21:01 PM   CARE MANAGEMENT NOTE 04/19/2012  Patient:  Terry Herman, Terry Herman   Account Number:  [id number]  Date Initiated:  04/18/2012  Documentation initiated by:  Rosemary Holms  Subjective/Objective Assessment:   pt admitted from home with spouse with l. hip osteoarthritis. PO from surgery. Plans to DC home with [facility].     Action/Plan:   HH set up with [facility]   Anticipated DC Date:  04/19/2012   Anticipated DC Plan:  HOME W HOME HEALTH SERVICES      DC Planning Services  CM consult      Choice offered to / List presented to:  C-3 Spouse   DME arranged  3-N-1      DME agency  [facility]     [facility] arranged  HH-1 RN  HH-2 PT  HH-3 OT      [facility] agency  [facility]   Status of service:  In process, will continue to follow Medicare Important Message given?  YES (If response is "NO", the following Medicare IM given date fields will be blank) Date Medicare IM given:  04/18/2012 Date Additional Medicare IM given:    Discharge Disposition:  HOME W HOME HEALTH SERVICES  Per UR Regulation:    If discussed at Long Length of Stay Meetings, dates discussed:    Comments:  04/18/12 Rosemary Holms RN BSN CM HH set up with Alroy Bailiff with [facility]. 3 & 1 delivered to room by L. [place] for anticipated DC tomorrow.

## 2012-04-18 NOTE — Progress Notes (Signed)
Subjective: 2 Days Post-Op Procedure(s) (LRB): TOTAL HIP ARTHROPLASTY (Left) Patient reports pain as 3 on 0-10 scale.    Objective: Vital signs in last 24 hours: Temp:  [97.1 F (36.2 C)-99.3 F (37.4 C)] 98.4 F (36.9 C) (11/06 0558) Pulse Rate:  [59-107] 100  (11/06 0558) Resp:  [18-20] 18  (11/06 0558) BP: (96-146)/(40-66) 97/53 mmHg (11/06 0558) SpO2:  [91 %-96 %] 92 % (11/06 0558)  Intake/Output from previous day: 11/05 0701 - 11/06 0700 In: 2649.9 [P.O.:840; I.V.:863.8; Blood:946.1] Out: 2100 [Urine:2100] Intake/Output this shift:     Basename 04/18/12 0446 04/17/12 0511  HGB 10.4* 7.7*    Basename 04/18/12 0446 04/17/12 0511  WBC 8.0 6.0  RBC 3.32* 2.36*  HCT 30.5* 22.5*  PLT 217 229    Basename 04/17/12 0511  NA 133*  K 4.0  CL 97  CO2 30  BUN 23  CREATININE 1.52*  GLUCOSE 125*  CALCIUM 8.5   No results found for this basename: LABPT:2,INR:2 in the last 72 hours  Neurologically intact Neurovascular intact Sensation intact distally Intact pulses distally Dorsiflexion/Plantar flexion intact Incision: dressing C/D/I  Assessment/Plan: 2 Days Post-Op Procedure(s) (LRB): TOTAL HIP ARTHROPLASTY (Left) Up with therapy Plan for discharge tomorrow Discharge home with home health  Fuller Canada 04/18/2012, 7:08 AM

## 2012-04-18 NOTE — Progress Notes (Addendum)
Nutrition Brief Note  Patient identified due to underweight status and s/p Total hip arthroplasty. He denies change in eating habits or wt loss and says that he's never weighed more 130#.   Body mass index is 17.10 kg/(m^2). Pt meets criteria for underweight based on current BMI.   Current diet order is Regular, patient is consuming approximately 50-90% % of meals at this time. Labs and medications reviewed.   No nutrition interventions warranted at this time. If nutrition issues arise, please consult RD.   [phone]

## 2012-04-18 NOTE — Progress Notes (Signed)
Physical Therapy Treatment Patient Details Name: Terry Herman MRN: [id number] DOB: 05/15/1946 Today's Date: 04/18/2012 Time: 4098-1191 PT Time Calculation (min): 21 min 1 therex 1 gt  PT Assessment / Plan / Recommendation Comments on Treatment Session  Patient continues to improve gait technique and distance;completeing 110' with RW;supervision;no LOB. Discussed stair situation at home, patient has 2 rails at home with one large step at top with no rail. Next treatment today demonstrated stair usage with 2 rails and backwards with RW.    Follow Up Recommendations        Does the patient have the potential to tolerate intense rehabilitation     Barriers to Discharge        Equipment Recommendations       Recommendations for Other Services    Frequency     Plan      Precautions / Restrictions     Pertinent Vitals/Pain     Mobility  Bed Mobility Supine to Sit: 6: Modified independent (Device/Increase time) Sitting - Scoot to Edge of Bed: 6: Modified independent (Device/Increase time) Transfers Sit to Stand: 4: Min guard Stand to Sit: 5: Supervision Details for Transfer Assistance: verbal cueing required today for proper hand placment Ambulation/Gait Ambulation/Gait Assistance: 5: Supervision Ambulation Distance (Feet): 110 Feet Assistive device: Rolling walker Ambulation/Gait Assistance Details: occasional step-through pattern performed Gait Pattern: Step-through pattern;Step-to pattern;Decreased stance time - right;Decreased stance time - left;Trunk flexed    Exercises General Exercises - Lower Extremity Quad Sets: Both;10 reps Gluteal Sets: 10 reps Short Arc Quad: Left;10 reps Long Arc Quad: Both;10 reps Heel Slides: Left;10 reps   PT Diagnosis:    PT Problem List:   PT Treatment Interventions:     PT Goals    Visit Information  Last PT Received On: 04/18/12    Subjective Data      Cognition       Balance     End of Session PT - End of  Session Equipment Utilized During Treatment: Gait belt Activity Tolerance: Patient tolerated treatment well Patient left: in chair;with call bell/phone within reach;with family/visitor present   GP     Terry Herman 04/18/2012, 10:47 AM

## 2012-04-18 NOTE — Progress Notes (Signed)
Patient wanted medicine to help him sleep. Dr. Hilda Lias was called and new orders were given for Ambien 5mg  qHS PRN

## 2012-04-18 NOTE — Progress Notes (Signed)
Occupational Therapy Treatment Patient Details Name: Terry Herman MRN: [id number] DOB: 05-29-1946 Today's Date: 04/18/2012 Time: 1435-1500 OT Time Calculation (min): 25 min  OT Assessment / Plan / Recommendation Comments on Treatment Session Educated patient on Bil UE theraband HEP. Therapist demonstrated exercises and patient returned demo. Wife present during tx session. No report of pain when stationary.                    Plan Discharge plan remains appropriate    Precautions / Restrictions Precautions Precautions: Anterior Hip;Fall Restrictions Weight Bearing Restrictions: No   Pertinent Vitals/Pain No complaints.      OT Goals Arm Goals Pt Will Complete Theraband Exer: with supervision, verbal cues required/provided;to maintain strength;Bilateral upper extremities;2 sets;10 reps;Level 2 Theraband Arm Goal: Theraband Exercises - Progress: Met  Visit Information  Last OT Received On: 04/18/12 Assistance Needed: +1    Subjective Data  Subjective: "I [place]um[place]'t have any pain unless I move." Patient Stated Goal: To go home.      Cognition  Overall Cognitive Status: Appears within functional limits for tasks assessed/performed Arousal/Alertness: Awake/alert Orientation Level: Appears intact for tasks assessed Behavior During Session: [facility] for tasks performed    Mobility   Bed Mobility Bed Mobility: Supine to Sit;Sit to Supine Supine to Sit: 6: Modified independent (Device/Increase time) Sit to Supine: 6: Modified independent (Device/Increase time) Details for Bed Mobility Assistance: No assist with LE needed for sit<>supine.       Exercises  General Exercises - Upper Extremity Shoulder Flexion: Strengthening;Both;Theraband;Seated (12 reps) Theraband Level (Shoulder Flexion): Level 2 (Red) Shoulder Extension: Strengthening;Both;Theraband;Seated (12 reps) Theraband Level (Shoulder Extension): Level 2 (Red) Shoulder ADduction: Strengthening;Both;Theraband;Seated  (diagonal; 12 reps) Theraband Level (Shoulder Adduction): Level 2 (Red) Shoulder Horizontal ABduction: Strengthening;Both;Seated;Theraband (12 reps) Theraband Level (Shoulder Horizontal Abduction): Level 2 (Red) Elbow Flexion: Strengthening;Both;Seated;Theraband (12 reps) Theraband Level (Elbow Flexion): Level 2 (Red) Other Exercises Other Exercises: Bil UE; 12 reps; 1 set; theraband level 2; seated; strengthening; external rotation. Other Exercises: Bil UE; 12 reps; 1 set; theraband level 2; strengthening; seated; reverse diagonal flys. Other Exercises: Bil UE; 12 reps; 1 set; theraband level 2; strengthing; seated; elastic band flys.      End of Session OT - End of Session Activity Tolerance: Patient tolerated treatment well Patient left: in bed;with call bell/phone within reach;with family/visitor present    Limmie Patricia, OTR/L 04/18/2012, 3:07 PM

## 2012-04-18 NOTE — Progress Notes (Signed)
Patient ID: Terry Herman, male   DOB: 1945-10-19, 66 y.o.   MRN: [id number]   The patient was transfused for acute blood loss anemia secondary to surgery on his total hip  Hemoglobin dropped to 7.7. Transfuse 2 units hemoglobin responded hemoglobin of 10.

## 2012-04-19 ENCOUNTER — Encounter (HOSPITAL_COMMUNITY): Payer: Self-pay | Admitting: Orthopedic Surgery

## 2012-04-19 DIAGNOSIS — D62 Acute posthemorrhagic anemia: Secondary | ICD-10-CM | POA: Diagnosis not present

## 2012-04-19 LAB — CBC
MCH: 31.9 pg (ref 26.0–34.0)
MCHC: 34.6 g/dL (ref 30.0–36.0)
Platelets: 202 10*3/uL (ref 150–400)

## 2012-04-19 MED ORDER — METHOCARBAMOL 500 MG PO TABS: 500.0000 mg | ORAL_TABLET | Freq: Four times a day (QID) | ORAL | Status: DC | PRN

## 2012-04-19 MED ORDER — FERROUS SULFATE 325 (65 FE) MG PO TABS: 325.0000 mg | ORAL_TABLET | Freq: Three times a day (TID) | ORAL | Status: DC

## 2012-04-19 MED ORDER — FERROUS SULFATE 325 (65 FE) MG PO TABS
325.0000 mg | ORAL_TABLET | Freq: Three times a day (TID) | ORAL | Status: DC
  Administered 2012-04-19: 325 mg via ORAL
  Filled 2012-04-19: qty 1

## 2012-04-19 MED ORDER — ENOXAPARIN SODIUM 30 MG/0.3ML ~~LOC~~ SOLN: 30.0000 mg | SUBCUTANEOUS | Status: DC

## 2012-04-19 MED ORDER — SENNOSIDES-DOCUSATE SODIUM 8.6-50 MG PO TABS: 1.0000 | ORAL_TABLET | Freq: Every evening | ORAL | Status: DC | PRN

## 2012-04-19 MED ORDER — OXYCODONE-ACETAMINOPHEN 5-325 MG PO TABS: 1.0000 | ORAL_TABLET | ORAL | Status: DC

## 2012-04-19 NOTE — Progress Notes (Signed)
Physical Therapy Treatment Patient Details Name: Terry Herman MRN: [id number] DOB: Jan 21, 1946 Today's Date: 04/19/2012 Time: 4098-1191 PT Time Calculation (min): 27 min  PT Assessment / Plan / Recommendation Comments on Treatment Session  Amb 180' with RW and min assistance with vc-ing to increase stance time L LE and stride length R LE.  Pt instructed stair training backwards for preparation to return to home safely.  Pt able to demonstrate appropriate technique following cues.  No complaint of increased pain through session.      Follow Up Recommendations        Does the patient have the potential to tolerate intense rehabilitation     Barriers to Discharge        Equipment Recommendations       Recommendations for Other Services    Frequency     Plan      Precautions / Restrictions Precautions Precautions: Anterior Hip    Mobility  Bed Mobility Supine to Sit: 7: Independent Sitting - Scoot to Edge of Bed: 7: Independent Transfers Transfers: Sit to Stand;Stand to Sit Sit to Stand: 5: Supervision Stand to Sit: 5: Supervision Details for Transfer Assistance: pt able to demonstrate appropriate hand placement without cueing this session. Ambulation/Gait Ambulation/Gait Assistance: 5: Supervision Ambulation Distance (Feet): 180 Feet Assistive device: Rolling walker Gait Pattern: Step-through pattern;Decreased stance time - left Stairs: Yes Stairs Assistance: 4: Min assist Stair Management Technique: Backwards;With walker Number of Stairs: 3  (one step backwards 3 attempts)    Exercises     PT Diagnosis:    PT Problem List:   PT Treatment Interventions:     PT Goals Acute Rehab PT Goals PT Goal: Supine/Side to Sit - Progress: Met PT Goal: Sit to Stand - Progress: Met PT Goal: Ambulate - Progress: Met PT Goal: Up/Down Stairs - Progress: Progressing toward goal  Visit Information  Last PT Received On: 04/19/12    Subjective Data  Subjective: Pt ready to go  home, pain scale L hip 3/10   Cognition  Overall Cognitive Status: Appears within functional limits for tasks assessed/performed Arousal/Alertness: Awake/alert Orientation Level: Appears intact for tasks assessed Behavior During Session: [facility] for tasks performed    Balance     End of Session PT - End of Session Equipment Utilized During Treatment: Gait belt Activity Tolerance: Patient tolerated treatment well Patient left: in chair Nurse Communication: Mobility status   GP     Juel Burrow 04/19/2012, 8:55 AM

## 2012-04-19 NOTE — Discharge Summary (Signed)
Physician Discharge Summary  Patient ID: Terry Herman MRN: [id number] DOB/AGE: [birth year] 66 y.o.  Admit date: 04/16/2012 Discharge date: 04/19/2012  Admission Diagnoses: AVN LEFT HIP WITH SUBSEQUENT OA   Discharge Diagnoses: SAME Active Problems:  Acute blood loss anemia   Discharged Condition: good  Hospital Course: The patient was admitted on November 4 for left total hip arthroplasty which was well tolerated under spinal anesthetic with small amount of blood loss. We inserted a Stryker total hip arthroplasty with an Accolate 2 femoral stem and a 36 metal head with a 58 polyethylene liner and a 58 shell. The patient did well postoperatively. He did have some heartburn from a nicotine patch which was removed in his heartburn resolved. He also had acute blood loss anemia with a hemoglobin of 7.7 he received 2 units of blood his hemoglobin went up to 10 and then subsequently dropped to 8.8 but he was minimally symptomatic. He was able to ambulate over 100 feet and tolerated his exercise program well.  Consults: None  Significant Diagnostic Studies: hg 8.8  Treatments: surgery: left tha stryker ACCOLADE II 78F, 58 SHELL, 58 / 36 POLY NO SCREWS (BONE REAMINGS GRAFTED IN ACETABULUM) 36 METAL HEAD   Discharge Exam: Blood pressure 88/46, pulse 90, temperature 97.5 F (36.4 C), temperature source Oral, resp. rate 20, height 5\' 11"  (1.803 m), weight 122 lb 9.6 oz (55.611 kg), SpO2 94.00%. General appearance: alert, cooperative and appears stated age Neurologic: Grossly normal Incision/Wound:C/D/I/  Disposition: Final discharge disposition not confirmed  Discharge Orders    Future Appointments: Provider: Department: Dept Phone: Center:   04/30/2012 2:30 PM Vickki Hearing, MD [facility] [id number] ROS[facility]     Future Orders Please Complete By Expires   Walker standard      Commode elevated 3 in 1      Shower chair      Diet - low sodium heart healthy        Call MD / Call 911      Comments:   If you experience chest pain or shortness of breath, CALL 911 and be transported to the hospital emergency room.  If you develope a fever above 101 F, pus (white drainage) or increased drainage or redness at the wound, or calf pain, call your surgeon's office.   Constipation Prevention      Comments:   Drink plenty of fluids.  Prune juice may be helpful.  You may use a stool softener, such as Colace (over the counter) 100 mg twice a day.  Use MiraLax (over the counter) for constipation as needed.   Increase activity slowly as tolerated      Discharge instructions      Comments:   Gradually increase activity as tolerated   lovenox injections every other day   Driving restrictions      Comments:   No driving for 3 weeks   Follow the hip precautions as taught in Physical Therapy      Change dressing      Comments:   You may change your dressing on friday, then change the dressing daily with sterile 4 x 4 inch gauze dressing and paper tape.  You may clean the incision with alcohol prior to redressing   TED hose      Comments:   Use stockings (TED hose) for 4 weeks on both leg(s).  You may remove them at night for sleeping.       Medication List  As of 04/19/2012  7:05 AM    TAKE these medications         enoxaparin 30 MG/0.3ML injection   Commonly known as: LOVENOX   Inject 0.3 mLs (30 mg total) into the skin every other day.      ferrous sulfate 325 (65 FE) MG tablet   Take 1 tablet (325 mg total) by mouth 3 (three) times daily with meals.      lisinopril-hydrochlorothiazide 20-12.5 MG per tablet   Commonly known as: PRINZIDE,ZESTORETIC   Take 1 tablet by mouth daily.      methocarbamol 500 MG tablet   Commonly known as: ROBAXIN   Take 1 tablet (500 mg total) by mouth every 6 (six) hours as needed.      oxyCODONE-acetaminophen 5-325 MG per tablet   Commonly known as: PERCOCET/ROXICET   Take 1 tablet by mouth every 4 (four) hours.       pravastatin 40 MG tablet   Commonly known as: PRAVACHOL   Take 40 mg by mouth daily.      senna-docusate 8.6-50 MG per tablet   Commonly known as: Senokot-S   Take 1 tablet by mouth at bedtime as needed.      ASK your doctor about these medications         naproxen 500 MG tablet   Commonly known as: NAPROSYN   Take 500 mg by mouth 2 (two) times daily.           Follow-up Information    Follow up with Fuller Canada, MD. On 04/30/2012.   Contact information:   [address] [place] [address] [phone]         Routine followup on November 18. Staples will come out if not taken out prior. Signed: Fuller Canada 04/19/2012, 7:05 AM

## 2012-04-19 NOTE — Progress Notes (Signed)
Pt d/c home via personal vehicle driven by daughter. Discharge instructions and meds reviewed woth pt with good understanding. Currently voices no c/o pain or discomfort.

## 2012-04-21 LAB — TYPE AND SCREEN
ABO/RH(D): O POS
Antibody Screen: NEGATIVE
Unit division: 0

## 2012-04-25 ENCOUNTER — Encounter: Payer: Self-pay | Admitting: Gastroenterology

## 2012-04-25 NOTE — Progress Notes (Signed)
REVIEWED.  

## 2012-04-25 NOTE — Progress Notes (Signed)
Pt is aware of OV on 05/22/12 @ 2 pm with AS and appt card was mailed

## 2012-04-30 ENCOUNTER — Encounter: Payer: Self-pay | Admitting: Orthopedic Surgery

## 2012-04-30 ENCOUNTER — Ambulatory Visit (INDEPENDENT_AMBULATORY_CARE_PROVIDER_SITE_OTHER): Payer: Medicare Other | Admitting: Orthopedic Surgery

## 2012-04-30 VITALS — BP 140/70 | Ht 71.0 in | Wt 122.0 lb

## 2012-04-30 DIAGNOSIS — Z96649 Presence of unspecified artificial hip joint: Secondary | ICD-10-CM | POA: Insufficient documentation

## 2012-04-30 NOTE — Progress Notes (Signed)
Patient ID: Terry Herman, male   DOB: Oct 17, 1945, 66 y.o.   MRN: [id number] Chief Complaint  Patient presents with  . Follow-up    Post op, left THA. April 13, 2012    Diagnosis avascular necrosis, LEFT hip.  DVT prophylaxis. Lovenox and TED stockings.  Ambulation status with a walker weightbearing as tolerated.  Therapy at home soon to convert him to outpatient physical therapy order has been written.  The patient was taken stockings off he says is very mobile at this time.  We removed his staples. His wound looks clean.  He will followup in 4 weeks for postop week #6 visit status post LEFT total hip

## 2012-04-30 NOTE — Patient Instructions (Addendum)
Call hospital when home therapy is finished to start outpatient therapy at [facility]

## 2012-05-08 ENCOUNTER — Ambulatory Visit (HOSPITAL_COMMUNITY)
Admission: RE | Admit: 2012-05-08 | Discharge: 2012-05-08 | Disposition: A | Discharge status: Home / Self Care | Payer: Medicare Other | Source: Ambulatory Visit | Attending: Orthopedic Surgery | Admitting: Orthopedic Surgery

## 2012-05-08 DIAGNOSIS — IMO0001 Reserved for inherently not codable concepts without codable children: Secondary | ICD-10-CM | POA: Insufficient documentation

## 2012-05-08 DIAGNOSIS — R262 Difficulty in walking, not elsewhere classified: Secondary | ICD-10-CM | POA: Insufficient documentation

## 2012-05-08 DIAGNOSIS — M25659 Stiffness of unspecified hip, not elsewhere classified: Secondary | ICD-10-CM | POA: Insufficient documentation

## 2012-05-08 DIAGNOSIS — Z96649 Presence of unspecified artificial hip joint: Secondary | ICD-10-CM | POA: Insufficient documentation

## 2012-05-08 NOTE — Evaluation (Signed)
Physical Therapy Evaluation  Patient Details  Name: Terry Herman MRN: [id number] Date of Birth: [birth year]  Today's Date: 05/08/2012 Time: 1191-4782 PT Time Calculation (min): 42 min  Visit#: 1  of 8   Re-eval: 06/07/12 Assessment Diagnosis: L THR Surgical Date: 04/16/12 Next MD Visit: 05/28/2012 Prior Therapy: HH  Authorization: medicare  Authorization Visit#: 1  of 8    Past Medical History:  Past Medical History  Diagnosis Date  . Hypertension   . Hypercholesterolemia   . Hip pain     Scheduled for hip replacement in Nov 2013 with Dr. Romeo Apple  . Shortness of breath     exertion  . Arthritis    Past Surgical History:  Past Surgical History  Procedure Date  . None   . Artificial testicle placed 40 years ago  . Total hip arthroplasty 04/16/2012    Procedure: TOTAL HIP ARTHROPLASTY;  Surgeon: Vickki Hearing, MD;  Location: [facility];  Service: Orthopedics;  Laterality: Left;    Subjective Symptoms/Limitations Symptoms: Mr. Paule states that he was diagnosed with avascular necrotic hips and recieved a  Left THR on 04/16/2012 and  was discharged on 04/19/12.  He received home health and is now being referred to outpatient PT.  The patient states that at this time his main concern is he is having difficulty bending forward to tie his shoe.  He states that he was not using an assistve device to walk with and he is nowusing a walker both in and out of his home.    Pertinent History: Pt needs a total hip on the right hip which will be done in approximately 12 weeks.   How long can you sit comfortably?: The patient is able to sit for an hour without having increased pain. How long can you stand comfortably?: The patient states he has not gotten in a shower yet the most he has stood is to shave which took about ten minutes. How long can you walk comfortably?: The longest that he has walked wtih the walker is less about five minutes.  Pain Assessment Currently in Pain?:  Yes Pain Score: 0-No pain (patient is taking pain meds every four hours.  ) Pain Location: Hip Pain Orientation: Left Pain Type: Chronic pain  Precautions/Restrictions  Precautions Precautions: Anterior Hip;Fall  Prior Functioning  Home Living Lives With: Family Prior Function Vocation: Retired Leisure: Hobbies-yes (Comment) Comments: golf  Cognition/Observation Cognition Overall Cognitive Status: Appears within functional limits for tasks assessed Arousal/Alertness: Awake/alert  Sensation/Coordination/Flexibility/Functional Tests Functional Tests Functional Tests: ABC 70%  Assessment RLE Strength Right Hip Flexion: 3/5 Right Hip Extension: 4/5 Right Knee Flexion: 5/5 Right Knee Extension: 5/5 Right Ankle Dorsiflexion: 4/5 LLE Strength Left Hip Flexion: 5/5 Left Hip Extension: 4/5 Left Knee Flexion: 5/5 Left Knee Extension: 5/5 Left Ankle Dorsiflexion: 4/5  Exercise/Treatments Mobility/Balance  Static Standing Balance Tandem Stance - Right Leg: 17  Tandem Stance - Left Leg: 0     Standing Heel Raises: 10 reps Functional Squat: 10 reps Gait Training: w/SPC   Supine Bridges: 10 reps    Physical Therapy Assessment and Plan PT Assessment and Plan Clinical Impression Statement: Pt S/P THR on 04/16/2012 who will benefit from skilled PT in the out-patient setting to improve balance to increased I and safety.  Returning  pt to pre -surgery status which was ambulating  without an assistive device might be limited due to pain in patient's right hip that needs replaced. Pt will benefit from skilled therapeutic intervention in order  to improve on the following deficits: Abnormal gait;Decreased activity tolerance;Decreased balance;Decreased mobility;Decreased range of motion;Difficulty walking;Pain Rehab Potential: Good PT Frequency: Min 2X/week PT Duration: 4 weeks PT Treatment/Interventions: Gait training;DME instruction;Therapeutic exercise;Patient/family  education;Therapeutic activities PT Plan: begin bike, rockerboard A/P only, laeral and forward step ups,marching slowly in place and  terminal extension.  3 rd treatment add steps in department with [facility],     Goals Home Exercise Program Pt will Perform Home Exercise Program: Independently PT Short Term Goals Time to Complete Short Term Goals: 2 weeks PT Short Term Goal 1: Pt to be I in ambultation with cane inside his home PT Short Term Goal 2: Pt to be walking for 20 minutes without difficulty PT Short Term Goal 3: Pt to be able to tie shoes with ease. PT Long Term Goals Time to Complete Long Term Goals: 4 weeks PT Long Term Goal 1: Pt to be able to balance on R and L LE for at least 10 seconds to improve safety by reducing fall risk PT Long Term Goal 2: Pt ABC to be improved to 85% Long Term Goal 3: Pt to be able to walk for over 30 minutes without discomfort.  Problem List Patient Active Problem List  Diagnosis  . AVN (avascular necrosis of bone)  . OA (osteoarthritis) of hip  . Encounter for screening colonoscopy  . Weight loss  . Acute blood loss anemia  . S/P hip replacement  . Difficulty in walking  . Stiffness of hip joint    PT - End of Session Equipment Utilized During Treatment: Gait belt Activity Tolerance: Patient tolerated treatment well General Cognition: WFL for tasks performed PT Plan of Care PT Home Exercise Plan: given  GP Functional Assessment Tool Used: ABC  Functional Limitation: Mobility: Walking and moving around Mobility: Walking and Moving Around Current Status (A5409): At least 20 percent but less than 40 percent impaired, limited or restricted Mobility: Walking and Moving Around Goal Status [phone]): At least 1 percent but less than 20 percent impaired, limited or restricted  Ernestine Langworthy,CINDY 05/08/2012, 3:38 PM  Physician Documentation Your signature is required to indicate approval of the treatment plan as stated above.  Please sign and either  send electronically or make a copy of this report for your files and return this physician signed original.   Please mark one 1.__approve of plan  2. ___approve of plan with the following conditions.   ______________________________                                                          _____________________ Physician Signature                                                                                                             Date

## 2012-05-14 ENCOUNTER — Ambulatory Visit (HOSPITAL_COMMUNITY)
Admission: RE | Admit: 2012-05-14 | Discharge: 2012-05-14 | Disposition: A | Discharge status: Home / Self Care | Payer: Medicare Other | Source: Ambulatory Visit | Attending: Orthopedic Surgery | Admitting: Orthopedic Surgery

## 2012-05-14 DIAGNOSIS — IMO0001 Reserved for inherently not codable concepts without codable children: Secondary | ICD-10-CM | POA: Insufficient documentation

## 2012-05-14 DIAGNOSIS — Z96649 Presence of unspecified artificial hip joint: Secondary | ICD-10-CM | POA: Insufficient documentation

## 2012-05-14 DIAGNOSIS — R262 Difficulty in walking, not elsewhere classified: Secondary | ICD-10-CM | POA: Insufficient documentation

## 2012-05-14 DIAGNOSIS — M25659 Stiffness of unspecified hip, not elsewhere classified: Secondary | ICD-10-CM

## 2012-05-14 NOTE — Progress Notes (Signed)
Physical Therapy Treatment Patient Details  Name: Terry Herman MRN: [id number] Date of Birth: December 21, 1945  Today's Date: 05/14/2012 Time: 1430-1511 PT Time Calculation (min): 41 min  Visit#: 2  of 8   Re-eval: 06/07/12    Authorization: medicare  Authorization Time Period:    Authorization Visit#: 2  of 8    Subjective: Symptoms/Limitations Symptoms: Terry Herman  states that the cane he wanted to use was to short for him.  Suggested getting a cane at [employer]. Pt states he has no problem with his exercises. Pain Assessment Currently in Pain?:  (has pain in his right hip(non operated more than his L)) Pain Score: 0-No pain  Precautions/Restrictions     Exercise/Treatments  Stretches Passive Hamstring Stretch: 3 reps;30 seconds   Standing Heel Raises: 10 reps Terminal Knee Extension: 15 reps Lateral Step Up: Left;10 reps Forward Step Up: Left;10 reps Functional Squat: 10 reps Rocker Board: 1 minute SLS: R 14, L 7 Gait Training: gt trainer than gt w/ SPC.   Supine Bridges: 15 reps    Physical Therapy Assessment and Plan PT Assessment and Plan Clinical Impression Statement: Pt did very well on TM GT trainer.  LL 87.  Pt shows increased stability ambulating with cane and improved SLS.  Added new ex with verbal cuing for proper technique. PT Plan: instruct in stairclimbing with cane.    Goals    Problem List Patient Active Problem List  Diagnosis  . AVN (avascular necrosis of bone)  . OA (osteoarthritis) of hip  . Encounter for screening colonoscopy  . Weight loss  . Acute blood loss anemia  . S/P hip replacement  . Difficulty in walking  . Stiffness of hip joint    PT - End of Session Equipment Utilized During Treatment: Gait belt Activity Tolerance: Patient tolerated treatment well General Behavior During Session: [facility] for tasks performed Cognition: [facility] for tasks performed  GP    Terry Herman,CINDY 05/14/2012, 3:14 PM

## 2012-05-17 ENCOUNTER — Ambulatory Visit (HOSPITAL_COMMUNITY)
Admission: RE | Admit: 2012-05-17 | Discharge: 2012-05-17 | Disposition: A | Discharge status: Home / Self Care | Payer: Medicare Other | Source: Ambulatory Visit | Attending: Orthopedic Surgery | Admitting: Orthopedic Surgery

## 2012-05-17 DIAGNOSIS — M25659 Stiffness of unspecified hip, not elsewhere classified: Secondary | ICD-10-CM

## 2012-05-17 DIAGNOSIS — R262 Difficulty in walking, not elsewhere classified: Secondary | ICD-10-CM

## 2012-05-17 NOTE — Progress Notes (Signed)
Physical Therapy Treatment Patient Details  Name: Terry Herman MRN: [id number] Date of Birth: [birth year]  Today's Date: 05/17/2012 Time: 7829-5621 PT Time Calculation (min): 42 min  Visit#: 3  of 8   Re-eval: 06/07/12    Authorization: medicare  Authorization Time Period:    Authorization Visit#: 3  of 8    Subjective: Symptoms/Limitations Symptoms: Pt states he was sore fromthe last treatment.  Pt comes walking with cane.  Exercise/Treatments  Stretches Active Hamstring Stretch: 3 reps;30 seconds Passive Hamstring Stretch: 3 reps;30 seconds   Standing Heel Raises: 10 reps Terminal Knee Extension: 15 reps Lateral Step Up: Left;10 reps Forward Step Up: Left;10 reps Functional Squat: 15 reps Rocker Board: 2 minutes;Limitations Rocker Board Limitations: R/L and A/P  SLS: R 30, L 30 Gait Training: gt trainer than gt w/ SPC. Other Standing Knee Exercises: tandem gt. Seated Other Seated Knee Exercises: sit to stand x 4 Supine Bridges: 15 reps  Physical Therapy Assessment and Plan PT Assessment and Plan  Pt shows signs of fatigue with exercises. Clinical Impression Statement: Pt to department with SPC.  Continue with balance and strength to achieve goal of ambulation with no assistive device.    Goals    Problem List Patient Active Problem List  Diagnosis  . AVN (avascular necrosis of bone)  . OA (osteoarthritis) of hip  . Encounter for screening colonoscopy  . Weight loss  . Acute blood loss anemia  . S/P hip replacement  . Difficulty in walking  . Stiffness of hip joint       GP    RUSSELL,CINDY 05/17/2012, 3:22 PM

## 2012-05-21 ENCOUNTER — Ambulatory Visit (HOSPITAL_COMMUNITY)
Admission: RE | Admit: 2012-05-21 | Discharge: 2012-05-21 | Disposition: A | Discharge status: Home / Self Care | Payer: Medicare Other | Source: Ambulatory Visit | Attending: Orthopedic Surgery | Admitting: Orthopedic Surgery

## 2012-05-21 NOTE — Progress Notes (Signed)
Physical Therapy Treatment Patient Details  Name: DELFINO FRIESEN MRN: [id number] Date of Birth: [birth year]  Today's Date: 05/21/2012 Time: 4098-1191 PT Time Calculation (min): 39 min Visit#: 4  of 8   Re-eval: 06/07/12 Authorization: medicare  Authorization Visit#: 4  of 8   Charges:  therex 30, gait 8'   Subjective: Symptoms/Limitations Symptoms: NO pain in L hip, R hip hurting quite a bit and now popping when bend over to put on shoes   Exercise/Treatments Aerobic Tread Mill: 6' gait trainer 0.4 cycles/sec Standing Heel Raises: 15 reps Lateral Step Up: Left;Step Height: 6";10 reps;Hand Hold: 1 Forward Step Up: Left;Step Height: 6";10 reps;Hand Hold: 1 Step Down: Left;Step Height: 4";10 reps;Hand Hold: 1 Functional Squat: 15 reps Rocker Board: 2 minutes;Limitations Rocker Board Limitations: R/L and A/P  SLS: R 32", L 15" max of 3 Gait Training: gait trainer Other Standing Knee Exercises: tandem gt, retro gait, side stepping 1RT each      Physical Therapy Assessment and Plan PT Assessment and Plan Clinical Impression Statement: Pt. able to complete all balance, gait tasks without LOB or difficulty; trained with SPC on L side since R hip is giving way and more painful than L.  Able to increase to 6" step and add forward step downs with 4" step without diffculty. PT Plan: continue to progress; instruct stair negotiation using SPC next visit.     Problem List Patient Active Problem List  Diagnosis  . AVN (avascular necrosis of bone)  . OA (osteoarthritis) of hip  . Encounter for screening colonoscopy  . Weight loss  . Acute blood loss anemia  . S/P hip replacement  . Difficulty in walking  . Stiffness of hip joint     Lurena Nida, PTA/CLT 05/21/2012, 4:05 PM

## 2012-05-22 ENCOUNTER — Ambulatory Visit (INDEPENDENT_AMBULATORY_CARE_PROVIDER_SITE_OTHER): Payer: Medicare Other | Admitting: Gastroenterology

## 2012-05-22 ENCOUNTER — Encounter: Payer: Self-pay | Admitting: Gastroenterology

## 2012-05-22 VITALS — BP 139/56 | HR 77 | Temp 97.4°F | Ht 71.0 in | Wt 120.6 lb

## 2012-05-22 DIAGNOSIS — R634 Abnormal weight loss: Secondary | ICD-10-CM

## 2012-05-22 MED ORDER — PEG 3350-KCL-NA BICARB-NACL 420 G PO SOLR: 4000.0000 mL | ORAL | Status: DC

## 2012-05-22 NOTE — Progress Notes (Signed)
Referring Provider: Isabella Stalling, MD Primary Care Physician:  Isabella Stalling, MD Primary GI: Dr. Darrick Penna   Chief Complaint  Patient presents with  . Follow-up    HPI:   66 year old male who presents today in f/u for wt loss. Originally referred by Dr. Romeo Apple prior to hip surgery for screening colonoscopy, +/- EGD. I saw Terry Herman Oct 21st for evaluation; Wt 119 at that time, normally weighs 125-127. Also noted loss of appetite, new onset constipation at prior visit. No prior colonoscopy or upper endoscopy.   However, patient wanted to wait until after hip surgery before proceeding with GI work-up. Discussed with Dr. Romeo Apple, who agreed to proceed with surgery as long as no active GI bleeding. Pt underwent left total hip arthroplasty Nov 4th.   Notes after surgery took up to 4 days to have a BM. States constipation getting better. No rectal bleeding. Notes abdominal discomfort with constipation. No N/V. Good appetite. Wt stable. Denies early satiety.   labs from July 2013 reviewed. Hgb 15.8, Plts 368, BUN, Cr normal. LFTs normal. PSA normal. TSH 1.156, normal.  Lab Results  Component Value Date   WBC 7.0 04/19/2012   HGB 8.8* 04/19/2012   HCT 25.4* 04/19/2012   MCV 92.0 04/19/2012   PLT 202 04/19/2012    Past Medical History  Diagnosis Date  . Hypertension   . Hypercholesterolemia   . Hip pain     s/p left hip surgery Nov 4th with Dr. Romeo Apple  . Shortness of breath     exertion  . Arthritis     Past Surgical History  Procedure Date  . None   . Artificial testicle placed 40 years ago  . Total hip arthroplasty 04/16/2012    Procedure: TOTAL HIP ARTHROPLASTY;  Surgeon: Vickki Hearing, MD;  Location: [facility];  Service: Orthopedics;  Laterality: Left;    Current Outpatient Prescriptions  Medication Sig Dispense Refill  . ferrous sulfate 325 (65 FE) MG tablet Take 1 tablet (325 mg total) by mouth 3 (three) times daily with meals.  90 tablet  0  . methocarbamol  (ROBAXIN) 500 MG tablet Take 1 tablet (500 mg total) by mouth every 6 (six) hours as needed.  56 tablet  1  . naproxen (NAPROSYN) 500 MG tablet Take 500 mg by mouth 2 (two) times daily with a meal.       . oxyCODONE-acetaminophen (PERCOCET/ROXICET) 5-325 MG per tablet Take 1 tablet by mouth every 4 (four) hours.  70 tablet  0  . pravastatin (PRAVACHOL) 40 MG tablet Take 40 mg by mouth daily.       Marland Kitchen senna-docusate (SENOKOT-S) 8.6-50 MG per tablet Take 1 tablet by mouth at bedtime as needed.  30 tablet  0    Allergies as of 05/22/2012  . (No Known Allergies)    Family History  Problem Relation Age of Onset  . Arthritis    . Cancer    . Diabetes    . Colon cancer Neg Hx     History   Social History  . Marital Status: Married    Spouse Name: N/A    Number of Children: N/A  . Years of Education: N/A   Social History Main Topics  . Smoking status: Current Every Day Smoker -- 1.5 packs/day for 50 years    Types: Cigarettes  . Smokeless tobacco: None  . Alcohol Use: No     Comment: No ETOH in past 5 years, was previously an alcoholic.   Marland Kitchen  Drug Use: No  . Sexually Active: None   Other Topics Concern  . None   Social History Narrative  . None    Review of Systems: Gen: SEE HPI CV: Denies chest pain, palpitations, syncope, peripheral edema, and claudication. Resp: +cough GI: Denies vomiting blood, jaundice, and fecal incontinence.   Denies dysphagia or odynophagia. Derm: Denies rash, itching, dry skin Psych: Denies depression, anxiety, memory loss, confusion. No homicidal or suicidal ideation.  Heme: Denies bruising, bleeding, and enlarged lymph nodes.  Physical Exam: BP 139/56  Pulse 77  Temp 97.4 F (36.3 C) (Temporal)  Ht 5\' 11"  (1.803 m)  Wt 120 lb 9.6 oz (54.704 kg)  BMI 16.82 kg/m2 General:   Alert and oriented. No distress noted. Pleasant and cooperative. Thin.  Head:  Normocephalic and atraumatic. Eyes:  Conjuctiva clear without scleral icterus. Mouth:   Oral mucosa pink and moist.  Neck:  Supple, without mass or thyromegaly. Heart:  S1, S2 present without murmurs, rubs, or gallops. Regular rate and rhythm. Abdomen:  +BS, soft, non-tender and non-distended. No rebound or guarding. No HSM or masses noted. Msk:  Symmetrical without gross deformities. Normal posture. Extremities:  Without edema. Neurologic:  Alert and  oriented x4;  grossly normal neurologically. Skin:  Intact without significant lesions or rashes. Psych:  Alert and cooperative. Normal mood and affect.

## 2012-05-22 NOTE — Patient Instructions (Addendum)
We have set you up for a colonoscopy and upper endoscopy with Dr. Darrick Penna in the near future.  Further recommendations to follow.

## 2012-05-24 ENCOUNTER — Ambulatory Visit (HOSPITAL_COMMUNITY)
Admission: RE | Admit: 2012-05-24 | Discharge: 2012-05-24 | Disposition: A | Discharge status: Home / Self Care | Payer: Medicare Other | Source: Ambulatory Visit | Attending: Orthopedic Surgery | Admitting: Orthopedic Surgery

## 2012-05-24 NOTE — Progress Notes (Addendum)
Physical Therapy Treatment Patient Details  Name: Terry Herman MRN: [id number] Date of Birth: [birth year]  Today's Date: 05/24/2012 Time: 4098-1191 PT Time Calculation (min): 39 min  Visit#: 5  of 8   Re-eval: 06/07/12 Charges: Gait x 8' Therex x 30'  Authorization: medicare  Authorization Visit#: 5  of 8    Subjective: Symptoms/Limitations Symptoms: No pain at entry. Pain in R hip increased to 4/10 at end of session. Pain Assessment Currently in Pain?: No/denies   Exercise/Treatments Aerobic Tread Mill: 8' gait trainer 0.4 cycles/sec leg length 85" Standing Heel Raises: 15 reps Lateral Step Up: Left;Step Height: 6";Hand Hold: 1;15 reps Forward Step Up: Left;Step Height: 6";Hand Hold: 1;15 reps Step Down: Left;Step Height: 4";Hand Hold: 1;15 reps Functional Squat: 15 reps Rocker Board Limitations: R/L and A/P  Other Standing Knee Exercises: Tandem gt 2RT  Physical Therapy Assessment and Plan PT Assessment and Plan Clinical Impression Statement: Pt completes therex well with LLE but is limited by pain in R hip. Pt has most difficulty with tandem gait, Pt had 1 LOB with tandem gait requiring min assist to recover. Pt presents with improved stride length with gait training but required vc's to improve heel-toe pattern. Pt's L hip appears to progressing well. PT Plan: Continue to progress per PT POC; instruct stair negotiation using SPC next visit.     Problem List Patient Active Problem List  Diagnosis  . AVN (avascular necrosis of bone)  . OA (osteoarthritis) of hip  . Encounter for screening colonoscopy  . Weight loss  . Acute blood loss anemia  . S/P hip replacement  . Difficulty in walking  . Stiffness of hip joint    PT - End of Session Activity Tolerance: Patient tolerated treatment well General Behavior During Session: [facility] for tasks performed Cognition: [facility] for tasks performed  Seth Bake, PTA 05/24/2012, 5:26 PM

## 2012-05-28 ENCOUNTER — Ambulatory Visit (HOSPITAL_COMMUNITY)
Admission: RE | Admit: 2012-05-28 | Discharge: 2012-05-28 | Disposition: A | Discharge status: Home / Self Care | Payer: Medicare Other | Source: Ambulatory Visit | Attending: Orthopedic Surgery | Admitting: Orthopedic Surgery

## 2012-05-28 ENCOUNTER — Ambulatory Visit (INDEPENDENT_AMBULATORY_CARE_PROVIDER_SITE_OTHER): Payer: Medicare Other | Admitting: Orthopedic Surgery

## 2012-05-28 ENCOUNTER — Encounter: Payer: Self-pay | Admitting: Orthopedic Surgery

## 2012-05-28 DIAGNOSIS — M8700 Idiopathic aseptic necrosis of unspecified bone: Secondary | ICD-10-CM

## 2012-05-28 DIAGNOSIS — Z96649 Presence of unspecified artificial hip joint: Secondary | ICD-10-CM

## 2012-05-28 NOTE — Patient Instructions (Addendum)
Return to office for preop

## 2012-05-28 NOTE — Progress Notes (Signed)
Physical Therapy Re-evaluation / Discharge  Patient Details  Name: Terry Herman MRN: [id number] Date of Birth: Jul 08, 1945  Today's Date: 05/28/2012 Time: 1305-1340 PT Time Calculation (min): 35 min  Visit#: 6  of 8   Re-eval: 06/07/12 Diagnosis: L THR Surgical Date: 04/16/12 Next MD Visit: 05/28/2012 Prior Therapy: HH Authorization: medicare  Authorization Visit#: 6  of 8     Subjective Symptoms/Limitations Symptoms: S/P L THR.  No pain in L hip; Pain in R hip continues to stay at 4/10. Pain Assessment Currently in Pain?: Yes Pain Score:   4 Pain Location: Hip Pain Orientation: Left  Precautions/Restrictions  Precautions Precautions: Anterior Hip;Fall  Sensation/Coordination/Flexibility/Functional Tests Functional Tests Functional Tests: ABC 74% (was 70%)  Objective: RLE Strength Right Hip Flexion: 3+/5 (with pain; was 3/5) Right Hip Extension: 4/5 (was 4/5) Right Knee Flexion: 5/5 (was 5/5) Right Knee Extension: 5/5 (was 5/5) Right Ankle Dorsiflexion: 5/5 (was 4/5)  LLE Strength Left Hip Flexion: 5/5 (was 5/5) Left Hip Extension: 4/5 (was 4/5 at IE;  not tested due to precautions) Left Knee Flexion: 5/5 (was 5/5) Left Knee Extension: 5/5 (was 5/5) Left Ankle Dorsiflexion: 5/5 (was 4/5)  Exercise/Treatments Mobility/Balance  Static Standing Balance Tandem Stance - Right Leg: 30  (was 17 seconds max) Tandem Stance - Left Leg: 30  (was unable to hold, 0 seconds)  Aerobic Tread Mill: 8' gait trainer 0.4 cycles/sec leg length 85"    Physical Therapy Assessment and Plan PT Assessment and Plan Clinical Impression Statement: Pt. has met all goals as related to L hip; The only unmet goal is ABC (Activies-Specific Balance Confidence scale) to be 80% confident.  Pt. was 70% confident in being able to complete tasks at initial evaluation and only increased 4% to 74% at discharge.  This is in large part to R hip dysfunction and instability.   Pt. is still using SPC  because of this.  Pt. is compliant with HEP.  PT Plan: Suggest discharge to HEP; Pt. only limiting factor is R Hip.    Goals Home Exercise Program Pt will Perform Home Exercise Program: Independently PT Goal: Perform Home Exercise Program - Progress: Met  PT Short Term Goals Time to Complete Short Term Goals: 2 weeks PT Short Term Goal 1: Pt to be I in ambultation with cane inside his home PT Short Term Goal 1 - Progress: Met PT Short Term Goal 2: Pt to be walking for 20 minutes without difficulty PT Short Term Goal 2 - Progress: Met PT Short Term Goal 3: Pt to be able to tie shoes with ease. PT Short Term Goal 3 - Progress: Met (painful to tie Right but can)  PT Long Term Goals Time to Complete Long Term Goals: 4 weeks PT Long Term Goal 1: Pt to be able to balance on R and L LE for at least 10 seconds to improve safety by reducing fall risk PT Long Term Goal 1 - Progress: Met PT Long Term Goal 2: Pt ABC to be improved to 85% PT Long Term Goal 2 - Progress: Not met (increased only to 74% due to R hip instability) PT Long Term Goal 3: Pt to be able to walk for over 30 minutes without discomfort. PT Long Term Goal 3 Progress: Met (would hurt R hip )  Problem List Patient Active Problem List  Diagnosis  . AVN (avascular necrosis of bone)  . OA (osteoarthritis) of hip  . Encounter for screening colonoscopy  . Weight loss  . Acute  blood loss anemia  . S/P hip replacement  . Difficulty in walking  . Stiffness of hip joint    PT - End of Session Activity Tolerance: Patient tolerated treatment well General Behavior During Session: [facility] for tasks performed Cognition: [facility] for tasks performed  GP Functional Assessment Tool Used: ABC confidence scale Functional Limitation: Mobility: Walking and moving around Mobility: Walking and Moving Around Current Status ([id number]): At least 1 percent but less than 20 percent impaired, limited or restricted Mobility: Walking and Moving Around  Goal Status [phone]): At least 1 percent but less than 20 percent impaired, limited or restricted Mobility: Walking and Moving Around Discharge Status [phone]): At least 1 percent but less than 20 percent impaired, limited or restricted  Lurena Nida, PTA/CLT 05/28/2012, 1:48 PM

## 2012-05-28 NOTE — Progress Notes (Signed)
Patient ID: Terry Herman, male   DOB: [birth year], 66 y.o.   MRN: [id number] Chief Complaint  Patient presents with  . Follow-up    4 week recheck on left hip replacement. DOS 04-13-12.    1. AVN (avascular necrosis of bone)   2. S/P hip replacement    Doing well status post LEFT total hip arthroplasty.  The patient is to have a colonoscopy in January and then we will preop him for a RIGHT total hip arthroplasty.  He has excellent hip range of motion on the LEFT. His leg lengths are within 1/2 cm.  His RIGHT hip is painful with flexion as well as abduction and with internal rotation.  Status post LEFT total hip with RIGHT hip arthritis and avascular necrosis.  Preop visit. RIGHT total hip to be done February 3

## 2012-05-29 NOTE — Assessment & Plan Note (Addendum)
66 year old male with no prior colonoscopy, wt loss of 6-8 lbs in past several months, unintentional.. No signs of overt GI bleeding, Hgb in July 2013 normal but last Hgb in November in 8 range, secondary to acute blood loss anemia after hip surgery. TSH normal. Notes "harder" stools than normal. Feels lack of appetite began when new medication started in July. Occasional nausea. Needs TCS/EGD with [facility] for further assessment. Obtain most recent blood work from Dr. Janna Arch.   Proceed with colonoscopy +/- EGD with Dr. Darrick Penna in the near future. The risks, benefits, and alternatives have been discussed in detail with the patient. They state understanding and desire to proceed.  PROPOFOL due hx of ETOH abuse.

## 2012-05-30 NOTE — Progress Notes (Signed)
Faxed to PCP

## 2012-05-31 ENCOUNTER — Inpatient Hospital Stay (HOSPITAL_COMMUNITY)
Admission: RE | Admit: 2012-05-31 | Discharge: 2012-05-31 | Payer: Medicare Other | Source: Ambulatory Visit | Attending: Physical Therapy | Admitting: Physical Therapy

## 2012-05-31 DIAGNOSIS — M25659 Stiffness of unspecified hip, not elsewhere classified: Secondary | ICD-10-CM

## 2012-05-31 DIAGNOSIS — R262 Difficulty in walking, not elsewhere classified: Secondary | ICD-10-CM

## 2012-06-04 ENCOUNTER — Ambulatory Visit (HOSPITAL_COMMUNITY): Payer: Medicare Other | Admitting: Physical Therapy

## 2012-06-07 ENCOUNTER — Ambulatory Visit (HOSPITAL_COMMUNITY): Payer: Medicare Other | Admitting: *Deleted

## 2012-06-12 ENCOUNTER — Encounter (HOSPITAL_COMMUNITY): Payer: Self-pay | Admitting: Pharmacy Technician

## 2012-06-19 ENCOUNTER — Encounter (HOSPITAL_COMMUNITY)
Admission: RE | Admit: 2012-06-19 | Discharge: 2012-06-19 | Disposition: A | Discharge status: Home / Self Care | Payer: Medicare Other | Source: Ambulatory Visit | Attending: Gastroenterology | Admitting: Gastroenterology

## 2012-06-19 ENCOUNTER — Encounter (HOSPITAL_COMMUNITY): Payer: Self-pay

## 2012-06-19 LAB — BASIC METABOLIC PANEL
Calcium: 10 mg/dL (ref 8.4–10.5)
Chloride: 98 mEq/L (ref 96–112)
Creatinine, Ser: 1.14 mg/dL (ref 0.50–1.35)
GFR calc Af Amer: 76 mL/min — ABNORMAL LOW (ref >90)
Sodium: 137 mEq/L (ref 135–145)

## 2012-06-19 LAB — HEMOGLOBIN AND HEMATOCRIT, BLOOD
HCT: 41.7 % (ref 39.0–52.0)
Hemoglobin: 14 g/dL (ref 13.0–17.0)

## 2012-06-19 NOTE — Patient Instructions (Signed)
20 Terry Herman  06/19/2012   Your procedure is scheduled on:  06/26/12  Report to [facility] [place] [facility].  Call this number if you have problems the morning of surgery: [phone]   Remember:   Do not eat food:After Midnight.  May have clear liquids:until Midnight .  Clear liquids include soda, tea, black coffee, apple or grape juice, broth.  Take these medicines the morning of surgery with A SIP OF WATER: pain pill if needed   Do not wear jewelry, make-up or nail polish.  Do not wear lotions, powders, or perfumes. You may wear deodorant.  Do not shave 48 hours prior to surgery. Men may shave face and neck.  Do not bring valuables to the hospital.  Contacts, dentures or bridgework may not be worn into surgery.  Leave suitcase in the car. After surgery it may be brought to your room.  For patients admitted to the hospital, checkout time is 11:00 AM the day of discharge.   Patients discharged the day of surgery will not be allowed to drive home.  Name and phone number of your driver: family  Special Instructions: N/A   Please read over the following fact sheets that you were given: Anesthesia Post-op Instructions and Care and Recovery After Surgery   PATIENT INSTRUCTIONS POST-ANESTHESIA  IMMEDIATELY FOLLOWING SURGERY:  Do not drive or operate machinery for the first twenty four hours after surgery.  Do not make any important decisions for twenty four hours after surgery or while taking narcotic pain medications or sedatives.  If you develop intractable nausea and vomiting or a severe headache please notify your doctor immediately.  FOLLOW-UP:  Please make an appointment with your surgeon as instructed. You do not need to follow up with anesthesia unless specifically instructed to do so.  WOUND CARE INSTRUCTIONS (if applicable):  Keep a dry clean dressing on the anesthesia/puncture wound site if there is drainage.  Once the wound has quit draining you may leave it open to air.  Generally  you should leave the bandage intact for twenty four hours unless there is drainage.  If the epidural site drains for more than 36-48 hours please call the anesthesia department.  QUESTIONS?:  Please feel free to call your physician or the hospital operator if you have any questions, and they will be happy to assist you.      Esophagogastroduodenoscopy This is an endoscopic procedure (a procedure that uses a device like a flexible telescope) that allows your caregiver to view the upper stomach and small bowel. This test allows your caregiver to look at the esophagus. The esophagus carries food from your mouth to your stomach. They can also look at your duodenum. This is the first part of the small intestine that attaches to the stomach. This test is used to detect problems in the bowel such as ulcers and inflammation. PREPARATION FOR TEST Nothing to eat after midnight the day before the test. NORMAL FINDINGS Normal esophagus, stomach, and duodenum. Ranges for normal findings may vary among different laboratories and hospitals. You should always check with your doctor after having lab work or other tests done to discuss the meaning of your test results and whether your values are considered within normal limits. MEANING OF TEST  Your caregiver will go over the test results with you and discuss the importance and meaning of your results, as well as treatment options and the need for additional tests if necessary. OBTAINING THE TEST RESULTS It is your responsibility  to obtain your test results. Ask the lab or department performing the test when and how you will get your results. Document Released: 09/30/2004 Document Revised: 08/22/2011 Document Reviewed: 05/09/2008 [facility] Patient Information 2013 [place], [place]. Colonoscopy A colonoscopy is an exam to evaluate your entire colon. In this exam, your colon is cleansed. A long fiberoptic tube is inserted through your rectum and into your colon. The  fiberoptic scope (endoscope) is a long bundle of enclosed and very flexible fibers. These fibers transmit light to the area examined and send images from that area to your caregiver. Discomfort is usually minimal. You may be given a drug to help you sleep (sedative) during or prior to the procedure. This exam helps to detect lumps (tumors), polyps, inflammation, and areas of bleeding. Your caregiver may also take a small piece of tissue (biopsy) that will be examined under a microscope. LET YOUR CAREGIVER KNOW ABOUT:   Allergies to food or medicine.  Medicines taken, including vitamins, herbs, eyedrops, over-the-counter medicines, and creams.  Use of steroids (by mouth or creams).  Previous problems with anesthetics or numbing medicines.  History of bleeding problems or blood clots.  Previous surgery.  Other health problems, including diabetes and kidney problems.  Possibility of pregnancy, if this applies. BEFORE THE PROCEDURE   A clear liquid diet may be required for 2 days before the exam.  Ask your caregiver about changing or stopping your regular medications.  Liquid injections (enemas) or laxatives may be required.  A large amount of electrolyte solution may be given to you to drink over a short period of time. This solution is used to clean out your colon.  You should be present 60 minutes prior to your procedure or as directed by your caregiver. AFTER THE PROCEDURE   If you received a sedative or pain relieving medication, you will need to arrange for someone to drive you home.  Occasionally, there is a little blood passed with the first bowel movement. Do not be concerned. FINDING OUT THE RESULTS OF YOUR TEST Not all test results are available during your visit. If your test results are not back during the visit, make an appointment with your caregiver to find out the results. Do not assume everything is normal if you have not heard from your caregiver or the medical  facility. It is important for you to follow up on all of your test results. HOME CARE INSTRUCTIONS   It is not unusual to pass moderate amounts of gas and experience mild abdominal cramping following the procedure. This is due to air being used to inflate your colon during the exam. Walking or a warm pack on your belly (abdomen) may help.  You may resume all normal meals and activities after sedatives and medicines have worn off.  Only take over-the-counter or prescription medicines for pain, discomfort, or fever as directed by your caregiver. Do not use aspirin or blood thinners if a biopsy was taken. Consult your caregiver for medicine usage if biopsies were taken. SEEK IMMEDIATE MEDICAL CARE IF:   You have a fever.  You pass large blood clots or fill a toilet with blood following the procedure. This may also occur 10 to 14 days following the procedure. This is more likely if a biopsy was taken.  You develop abdominal pain that keeps getting worse and cannot be relieved with medicine. Document Released: 05/27/2000 Document Revised: 08/22/2011 Document Reviewed: 01/10/2008 [facility] Patient Information 2013 [place], [place].

## 2012-06-26 ENCOUNTER — Encounter (HOSPITAL_COMMUNITY): Payer: Self-pay | Admitting: Anesthesiology

## 2012-06-26 ENCOUNTER — Encounter (HOSPITAL_COMMUNITY)
Admission: RE | Disposition: A | Discharge status: Home / Self Care | Payer: Self-pay | Source: Ambulatory Visit | Attending: Gastroenterology

## 2012-06-26 ENCOUNTER — Ambulatory Visit (HOSPITAL_COMMUNITY): Payer: Medicare Other | Admitting: Anesthesiology

## 2012-06-26 ENCOUNTER — Ambulatory Visit (HOSPITAL_COMMUNITY)
Admission: RE | Admit: 2012-06-26 | Discharge: 2012-06-26 | Disposition: A | Discharge status: Home / Self Care | Payer: Medicare Other | Source: Ambulatory Visit | Attending: Gastroenterology | Admitting: Gastroenterology

## 2012-06-26 ENCOUNTER — Encounter (HOSPITAL_COMMUNITY): Payer: Self-pay | Admitting: *Deleted

## 2012-06-26 DIAGNOSIS — K2960 Other gastritis without bleeding: Secondary | ICD-10-CM

## 2012-06-26 DIAGNOSIS — I10 Essential (primary) hypertension: Secondary | ICD-10-CM | POA: Insufficient documentation

## 2012-06-26 DIAGNOSIS — K2940 Chronic atrophic gastritis without bleeding: Secondary | ICD-10-CM | POA: Insufficient documentation

## 2012-06-26 DIAGNOSIS — J449 Chronic obstructive pulmonary disease, unspecified: Secondary | ICD-10-CM | POA: Insufficient documentation

## 2012-06-26 DIAGNOSIS — K648 Other hemorrhoids: Secondary | ICD-10-CM

## 2012-06-26 DIAGNOSIS — Z01812 Encounter for preprocedural laboratory examination: Secondary | ICD-10-CM | POA: Insufficient documentation

## 2012-06-26 DIAGNOSIS — K2980 Duodenitis without bleeding: Secondary | ICD-10-CM

## 2012-06-26 DIAGNOSIS — J4489 Other specified chronic obstructive pulmonary disease: Secondary | ICD-10-CM | POA: Insufficient documentation

## 2012-06-26 DIAGNOSIS — R110 Nausea: Secondary | ICD-10-CM

## 2012-06-26 DIAGNOSIS — Z1211 Encounter for screening for malignant neoplasm of colon: Secondary | ICD-10-CM

## 2012-06-26 DIAGNOSIS — R634 Abnormal weight loss: Secondary | ICD-10-CM | POA: Insufficient documentation

## 2012-06-26 DIAGNOSIS — R6881 Early satiety: Secondary | ICD-10-CM | POA: Insufficient documentation

## 2012-06-26 HISTORY — PX: BIOPSY: SHX5522

## 2012-06-26 HISTORY — PX: ESOPHAGOGASTRODUODENOSCOPY (EGD) WITH PROPOFOL: SHX5813

## 2012-06-26 HISTORY — PX: COLONOSCOPY WITH PROPOFOL: SHX5780

## 2012-06-26 SURGERY — COLONOSCOPY WITH PROPOFOL
Anesthesia: Monitor Anesthesia Care

## 2012-06-26 MED ORDER — GLYCOPYRROLATE 0.2 MG/ML IJ SOLN
0.2000 mg | Freq: Once | INTRAMUSCULAR | Status: AC
  Administered 2012-06-26: 0.2 mg via INTRAVENOUS

## 2012-06-26 MED ORDER — PROPOFOL INFUSION 10 MG/ML OPTIME
INTRAVENOUS | Status: DC | PRN
  Administered 2012-06-26: 100 ug/kg/min via INTRAVENOUS

## 2012-06-26 MED ORDER — MIDAZOLAM HCL 2 MG/2ML IJ SOLN
INTRAMUSCULAR | Status: AC
  Filled 2012-06-26: qty 2

## 2012-06-26 MED ORDER — MIDAZOLAM HCL 2 MG/2ML IJ SOLN
1.0000 mg | INTRAMUSCULAR | Status: DC | PRN
  Administered 2012-06-26: 2 mg via INTRAVENOUS

## 2012-06-26 MED ORDER — FENTANYL CITRATE 0.05 MG/ML IJ SOLN: 25.0000 ug | INTRAMUSCULAR | Status: DC | PRN

## 2012-06-26 MED ORDER — BUTAMBEN-TETRACAINE-BENZOCAINE 2-2-14 % EX AERO
1.0000 | INHALATION_SPRAY | Freq: Once | CUTANEOUS | Status: AC
  Administered 2012-06-26: 1 via TOPICAL
  Filled 2012-06-26: qty 56

## 2012-06-26 MED ORDER — FENTANYL CITRATE 0.05 MG/ML IJ SOLN
INTRAMUSCULAR | Status: AC
  Filled 2012-06-26: qty 2

## 2012-06-26 MED ORDER — LACTATED RINGERS IV SOLN
INTRAVENOUS | Status: DC
  Administered 2012-06-26: 08:00:00 via INTRAVENOUS

## 2012-06-26 MED ORDER — PROPOFOL 10 MG/ML IV BOLUS
INTRAVENOUS | Status: DC | PRN
  Administered 2012-06-26: 20 mg via INTRAVENOUS

## 2012-06-26 MED ORDER — ONDANSETRON HCL 4 MG/2ML IJ SOLN
4.0000 mg | Freq: Once | INTRAMUSCULAR | Status: AC
  Administered 2012-06-26: 4 mg via INTRAVENOUS

## 2012-06-26 MED ORDER — ONDANSETRON HCL 4 MG/2ML IJ SOLN
INTRAMUSCULAR | Status: AC
  Filled 2012-06-26: qty 2

## 2012-06-26 MED ORDER — OMEPRAZOLE 20 MG PO CPDR: DELAYED_RELEASE_CAPSULE | ORAL | Status: AC

## 2012-06-26 MED ORDER — WATER FOR IRRIGATION, STERILE IR SOLN
Status: DC | PRN
  Administered 2012-06-26: 1000 mL

## 2012-06-26 MED ORDER — ONDANSETRON HCL 4 MG/2ML IJ SOLN: 4.0000 mg | Freq: Once | INTRAMUSCULAR | Status: DC | PRN

## 2012-06-26 MED ORDER — GLYCOPYRROLATE 0.2 MG/ML IJ SOLN
INTRAMUSCULAR | Status: AC
  Filled 2012-06-26: qty 1

## 2012-06-26 MED ORDER — FENTANYL CITRATE 0.05 MG/ML IJ SOLN
INTRAMUSCULAR | Status: DC | PRN
  Administered 2012-06-26: 50 ug via INTRAVENOUS

## 2012-06-26 MED ORDER — MIDAZOLAM HCL 5 MG/5ML IJ SOLN
INTRAMUSCULAR | Status: DC | PRN
  Administered 2012-06-26 (×2): 1 mg via INTRAVENOUS

## 2012-06-26 MED ORDER — LIDOCAINE HCL (PF) 1 % IJ SOLN
INTRAMUSCULAR | Status: AC
  Filled 2012-06-26: qty 2

## 2012-06-26 MED ORDER — STERILE WATER FOR IRRIGATION IR SOLN
Status: DC | PRN
  Administered 2012-06-26: 09:00:00

## 2012-06-26 SURGICAL SUPPLY — 23 items
BLOCK BITE 60FR ADLT L/F BLUE (MISCELLANEOUS) ×1 IMPLANT
ELECT REM PT RETURN 9FT ADLT (ELECTROSURGICAL)
ELECTRODE REM PT RTRN 9FT ADLT (ELECTROSURGICAL) IMPLANT
FCP BXJMBJMB 240X2.8X (CUTTING FORCEPS)
FLOOR PAD 36X40 (MISCELLANEOUS) ×2
FORCEPS BIOP RAD 4 LRG CAP 4 (CUTTING FORCEPS) ×1 IMPLANT
FORCEPS BIOP RJ4 240 W/NDL (CUTTING FORCEPS)
FORCEPS BXJMBJMB 240X2.8X (CUTTING FORCEPS) IMPLANT
INJECTOR/SNARE I SNARE (MISCELLANEOUS) IMPLANT
LUBRICANT JELLY 4.5OZ STERILE (MISCELLANEOUS) ×1 IMPLANT
MANIFOLD NEPTUNE II (INSTRUMENTS) ×2 IMPLANT
NDL SCLEROTHERAPY 25GX240 (NEEDLE) IMPLANT
NEEDLE SCLEROTHERAPY 25GX240 (NEEDLE) IMPLANT
PAD FLOOR 36X40 (MISCELLANEOUS) ×1 IMPLANT
PROBE APC STR FIRE (PROBE) IMPLANT
PROBE INJECTION GOLD (MISCELLANEOUS)
PROBE INJECTION GOLD 7FR (MISCELLANEOUS) IMPLANT
SNARE SHORT THROW 13M SML OVAL (MISCELLANEOUS) ×2 IMPLANT
SYR 50ML LL SCALE MARK (SYRINGE) ×2 IMPLANT
TRAP SPECIMEN MUCOUS 40CC (MISCELLANEOUS) IMPLANT
TUBING ENDO SMARTCAP PENTAX (MISCELLANEOUS) ×2 IMPLANT
TUBING IRRIGATION ENDOGATOR (MISCELLANEOUS) ×1 IMPLANT
WATER STERILE IRR 1000ML POUR (IV SOLUTION) ×1 IMPLANT

## 2012-06-26 NOTE — Anesthesia Postprocedure Evaluation (Signed)
  Anesthesia Post-op Note  Patient: Terry Herman  Procedure(s) Performed: Procedure(s) (LRB) with comments: COLONOSCOPY WITH PROPOFOL (N/A) - At cecum 0916, withdrawal time: 10 minutes, procedure end @ 0926 ESOPHAGOGASTRODUODENOSCOPY (EGD) WITH PROPOFOL (N/A) - procedure start @ 0932 BIOPSY () - gastric biopsy  Patient Location: PACU  Anesthesia Type:MAC  Level of Consciousness: awake, alert  and oriented  Airway and Oxygen Therapy: Patient Spontanous Breathing  Post-op Pain: none  Post-op Assessment: Post-op Vital signs reviewed, Patient's Cardiovascular Status Stable, Respiratory Function Stable, Patent Airway and No signs of Nausea or vomiting  Post-op Vital Signs: Reviewed and stable  Complications: No apparent anesthesia complications

## 2012-06-26 NOTE — Anesthesia Preprocedure Evaluation (Signed)
Anesthesia Evaluation  Patient identified by MRN, date of birth, ID band Patient awake    Reviewed: Allergy & Precautions, H&P , NPO status , Patient's Chart, lab work & pertinent test results  Airway Mallampati: I      Dental  (+) Edentulous Upper and Edentulous Lower   Pulmonary shortness of breath and with exertion, COPDCurrent Smoker (75 PY),  + rhonchi         Cardiovascular hypertension, Pt. on medications Rhythm:Regular Rate:Normal     Neuro/Psych    GI/Hepatic negative GI ROS,   Endo/Other    Renal/GU      Musculoskeletal   Abdominal   Peds  Hematology   Anesthesia Other Findings   Reproductive/Obstetrics                           Anesthesia Physical Anesthesia Plan  ASA: III  Anesthesia Plan: MAC   Post-op Pain Management:    Induction: Intravenous  Airway Management Planned: Simple Face Mask  Additional Equipment:   Intra-op Plan:   Post-operative Plan:   Informed Consent: I have reviewed the patients History and Physical, chart, labs and discussed the procedure including the risks, benefits and alternatives for the proposed anesthesia with the patient or authorized representative who has indicated his/her understanding and acceptance.     Plan Discussed with:   Anesthesia Plan Comments:         Anesthesia Quick Evaluation

## 2012-06-26 NOTE — Transfer of Care (Signed)
Immediate Anesthesia Transfer of Care Note  Patient: Terry Herman  Procedure(s) Performed: Procedure(s) (LRB) with comments: COLONOSCOPY WITH PROPOFOL (N/A) - At cecum 0916, withdrawal time: 10 minutes, procedure end @ 0926 ESOPHAGOGASTRODUODENOSCOPY (EGD) WITH PROPOFOL (N/A) - procedure start @ 0932 BIOPSY () - gastric biopsy  Patient Location: PACU  Anesthesia Type:MAC  Level of Consciousness: awake  Airway & Oxygen Therapy: Patient Spontanous Breathing and Patient connected to face mask oxygen  Post-op Assessment: Report given to PACU RN  Post vital signs: Reviewed  Complications: No apparent anesthesia complications

## 2012-06-26 NOTE — Op Note (Signed)
[facility] [address] [place], [address]   ENDOSCOPY PROCEDURE REPORT  PATIENT: Terry Herman, Terry Herman  MR#: [id number] BIRTHDATE: 1946/06/13 , 66  yrs. old GENDER: Male  ENDOSCOPIST: Jonette Eva, MD REFERRED [id number] Janna Arch, M.D.  PROCEDURE DATE: 06/26/2012 PROCEDURE:   EGD w/ biopsy  INDICATIONS:NAUSEA, WEIGHT LOSS, EARLY SATIETY. MEDICATIONS: MAC sedation, administered by CRNA TOPICAL ANESTHETIC:   Cetacaine Spray  DESCRIPTION OF PROCEDURE:     Physical exam was performed.  Informed consent was obtained from the patient after explaining the benefits, risks, and alternatives to the procedure.  The patient was connected to the monitor and placed in the left lateral position.  Continuous oxygen was provided by nasal cannula and IV medicine administered through an indwelling cannula.  After administration of sedation, the patients esophagus was intubated and the     endoscope was advanced under direct visualization to the second portion of the duodenum.  The scope was removed slowly by carefully examining the color, texture, anatomy, and integrity of the mucosa on the way out.  The patient was recovered in endoscopy and discharged home in satisfactory condition.      ESOPHAGUS: LINEAR EROSIONS IN THE DISTAL ESOPHAGUS.  NO BARRETT'S.   STOMACH: Moderate erosive gastritis (inflammation) was found in the gastric antrum.  Multiple biopsies were performed using cold forceps.  DUODENUM: Moderate duodenal inflammation was found in the duodenal bulb.   The duodenal mucosa showed no abnormalities in the 2nd part of the duodenum. COMPLICATIONS:   None  ENDOSCOPIC IMPRESSION: 1.   LINEAR EROSIONS IN THE DISTAL ESOPHAGUS DUE TO REFLUX ESOPHAGITIS. 2.   Erosive gastritis (inflammation) 3.   DuodenITIS 4.   NAUSEA/EARLY SATIETY DUE TO GASTRITIS. WEIGHT LOSS MOST LIKELY DUE TO CHRONIC PAIN +/- GASTRITIS   RECOMMENDATIONS:  HOLD NAPROXEN. RESTART JAN  20. OMEPRAZOLE 30 MINUTES PRIOR TO MEALS BID AWAIT BIOPSY HIGH FIBER DIET R HIP REPLACEMENT FEB 2014 OPV MAY 2014 AND CONSIDER REDUCING OMP TO QD. PT SHOUDL BE IN A PPI FOREVER   REPEAT EXAM:   _______________________________ Rosalie DoctorJonette Eva, MD 06/26/2012 11:48 AM       PATIENT NAME:  Terry Herman, Terry Herman MR#: [id number]

## 2012-06-26 NOTE — H&P (Addendum)
Primary Care Physician:  Isabella Stalling, MD Primary Gastroenterologist:  Dr. Darrick Penna  Pre-Procedure History & Physical: HPI:  Terry Herman is a 67 y.o. male here for Anorexia/weight loss/NAUSEA.  Past Medical History  Diagnosis Date  . Hypertension   . Hypercholesterolemia   . Hip pain     s/p left hip surgery Nov 4th with Dr. Romeo Apple  . Shortness of breath     exertion  . Arthritis     Past Surgical History  Procedure Date  . None   . Artificial testicle placed 40 years ago  . Total hip arthroplasty 04/16/2012    Procedure: TOTAL HIP ARTHROPLASTY;  Surgeon: Vickki Hearing, MD;  Location: [facility];  Service: Orthopedics;  Laterality: Left;    Prior to Admission medications   Medication Sig Start Date End Date Taking? Authorizing Provider  ferrous sulfate 325 (65 FE) MG tablet Take 325 mg by mouth 3 (three) times daily.   Yes Historical Provider, MD  naproxen (NAPROSYN) 500 MG tablet Take 500 mg by mouth 4 (four) times daily as needed. Pain 05/08/12  Yes Historical Provider, MD  pravastatin (PRAVACHOL) 40 MG tablet Take 40 mg by mouth daily.  01/10/12  Yes Historical Provider, MD  senna-docusate (SENOKOT-S) 8.6-50 MG per tablet Take 1 tablet by mouth daily. 04/19/12  Yes Vickki Hearing, MD  oxyCODONE-acetaminophen (PERCOCET/ROXICET) 5-325 MG per tablet Take 1 tablet by mouth every 4 (four) hours as needed. Pain 04/19/12   Vickki Hearing, MD    Allergies as of 05/22/2012  . (No Known Allergies)    Family History  Problem Relation Age of Onset  . Arthritis    . Cancer    . Diabetes    . Colon cancer Neg Hx     History   Social History  . Marital Status: Married    Spouse Name: N/A    Number of Children: N/A  . Years of Education: N/A   Occupational History  . Not on file.   Social History Main Topics  . Smoking status: Current Every Day Smoker -- 1.5 packs/day for 50 years    Types: Cigarettes  . Smokeless tobacco: Not on file  . Alcohol Use: No    Comment: No ETOH in past 5 years, was previously an alcoholic.   . Drug Use: No  . Sexually Active: Yes    Birth Control/ Protection: None   Other Topics Concern  . Not on file   Social History Narrative  . No narrative on file    Review of Systems: See HPI, otherwise negative ROS   Physical Exam: BP 169/86  Pulse 103  Temp 97.7 F (36.5 C) (Oral)  Resp 20  SpO2 95% General:   Alert,  pleasant and cooperative in NAD Head:  Normocephalic and atraumatic. Neck:  Supple; Lungs:  Clear throughout to auscultation.    Heart:  Regular rate and rhythm. Abdomen:  Soft, nontender and nondistended. Normal bowel sounds, without guarding, and without rebound.   Neurologic:  Alert and  oriented x4;  grossly normal neurologically.  Impression/Plan:    Anorexia/weight loss/NAUSEA  PLAN: 1. TCS/EGD TODAY

## 2012-06-26 NOTE — Op Note (Signed)
[facility] [address] [place], [address]   COLONOSCOPY PROCEDURE REPORT  PATIENT: Terry, Herman  MR#: [id number] BIRTHDATE: 11-15-1945 , 66  yrs. old GENDER: Male ENDOSCOPIST: Jonette Eva, MD REFERRED [id number] Janna Arch, M.D. PROCEDURE DATE:  06/26/2012 PROCEDURE:   Colonoscopy, screening INDICATIONS:Average risk patient for colon cancer. MEDICATIONS: MAC sedation, administered by CRNA  DESCRIPTION OF PROCEDURE:    Physical exam was performed.  Informed consent was obtained from the patient after explaining the benefits, risks, and alternatives to procedure.  The patient was connected to monitor and placed in left lateral position. Continuous oxygen was provided by nasal cannula and IV medicine administered through an indwelling cannula.  After administration of sedation and rectal exam, the patients rectum was intubated and the     colonoscope was advanced under direct visualization to the ileum.  The scope was removed slowly by carefully examining the color, texture, anatomy, and integrity mucosa on the way out.  The patient was recovered in endoscopy and discharged home in satisfactory condition.       COLON FINDINGS: The mucosa appeared normal in the terminal ileum.  , The colon was otherwise normal.  There was no diverticulosis, inflammation, polyps or cancers unless previously stated.  , and Small internal hemorrhoids were found.  PREP QUALITY: excellent. CECAL W/D TIME: 10 minutes  COMPLICATIONS: None  ENDOSCOPIC IMPRESSION: 1.   Normal mucosa in the terminal ileum 2.   The colon was otherwise normal 3.   Small internal hemorrhoids   RECOMMENDATIONS: HIGH FIBER DIET TCS IN 10 YEARS       _______________________________ eSignedJonette Eva, MD 06/26/2012 10:08 AM

## 2012-06-28 ENCOUNTER — Encounter (HOSPITAL_COMMUNITY): Payer: Self-pay | Admitting: Gastroenterology

## 2012-06-28 ENCOUNTER — Telehealth: Payer: Self-pay | Admitting: Gastroenterology

## 2012-06-28 NOTE — Telephone Encounter (Signed)
Called and informed pt.  

## 2012-06-28 NOTE — Telephone Encounter (Signed)
Please call pt. Terry Herman stomach Bx shows gastritis FROM NAPROZEN. 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     HOLD NAPROXEN. RESTART JAN 20.  CONTINUE OMEPRAZOLE 30 MINUTES PRIOR TO MEALS TWICE DAILY FOR 3 MOS THEN ONCE DAILY.  AVOID ITEMS THAT TRIGGER GASTRITIS.  FOLLOW A HIGH FIBER DIET. AVOID ITEMS THAT CAUSE BLOATING.   FOLLOW UP IN MAY 2014.

## 2012-06-28 NOTE — Telephone Encounter (Signed)
Path faxed to PCP, recall made 

## 2012-07-11 ENCOUNTER — Ambulatory Visit (INDEPENDENT_AMBULATORY_CARE_PROVIDER_SITE_OTHER): Payer: Medicare Other | Admitting: Orthopedic Surgery

## 2012-07-11 VITALS — BP 144/60 | Ht 71.0 in | Wt 119.0 lb

## 2012-07-11 DIAGNOSIS — M25559 Pain in unspecified hip: Secondary | ICD-10-CM

## 2012-07-11 DIAGNOSIS — Z96649 Presence of unspecified artificial hip joint: Secondary | ICD-10-CM

## 2012-07-11 DIAGNOSIS — M8700 Idiopathic aseptic necrosis of unspecified bone: Secondary | ICD-10-CM

## 2012-07-11 NOTE — Patient Instructions (Addendum)
You have been scheduled for total hip arthroplasty please stop all blood thinners one week prior to surgery  The risks and benefits of the procedure include but are not limited to wound infection, postoperative bleeding, operative the vein thrombosis or blood clot. Pulmonary embolus which can be fatal. Dislocation of the hip. Pain in the hip. Early hip failure due to excessive wear. The average time for recovery is 3-5 months.    Stop any blood thinning medication: such as warfarin, coumadin, naprosyn, ibuprofen, advil, diclofenac, aspirin

## 2012-07-11 NOTE — Progress Notes (Signed)
Patient ID: Terry Herman, male   DOB: [birth year], 67 y.o.   MRN: [id number] Chief Complaint  Patient presents with  . Procedure    preop right tha    Preoperative RIGHT total hip. Patient status post LEFT total hip, November of 2013 did well had recent colonoscopy with no evidence of cancer or polyps. He did have some gastritis, which was treated with oral medication. He is happy with his LEFT total hip and would like to have RIGHT total hip.  So we will go ahead and schedule that today.  Review of systems is negative for frequency, shortness of breath, chest pain, headache, numbness, tingling.  He has painful range of motion RIGHT hip. Leg lengths remained equal. RIGHT hip remained stable. Muscle tone and strength are normal. Skin is intact. Good distal pulse.  Painless range of motion LEFT hip.  Impression osteoarthritis, AVN RIGHT hip.  Plan RIGHT total hip

## 2012-07-23 ENCOUNTER — Telehealth: Payer: Self-pay | Admitting: Orthopedic Surgery

## 2012-07-23 NOTE — Telephone Encounter (Addendum)
Contacted insurer, [employer], ph# [phone], regarding in-patient/admit surgery scheduled for 08/06/12 at [facility], CPT [phone], ICD9 code 733.40.  Spoke with Yasmin K, and received pre-authorization # [id number], status pending..  Request may follow from utilization department/ nurse reviewer for clinicals to be faxed.

## 2012-07-24 ENCOUNTER — Encounter (HOSPITAL_COMMUNITY): Payer: Self-pay | Admitting: Pharmacy Technician

## 2012-07-30 NOTE — Patient Instructions (Addendum)
Terry Herman  07/30/2012   Your procedure is scheduled on:  08/06/2012  Report to [facility] at  900  AM.  Call this number if you have problems the morning of surgery: [phone]   Remember:   Do not eat food or drink liquids after midnight.   Take these medicines the morning of surgery with A SIP OF WATER: norvasc,percocet   Do not wear jewelry, make-up or nail polish.  Do not wear lotions, powders, or perfumes.   Do not shave 48 hours prior to surgery. Men may shave face and neck.  Do not bring valuables to the hospital.  Contacts, dentures or bridgework may not be worn into surgery.  Leave suitcase in the car. After surgery it may be brought to your room.  For patients admitted to the hospital, checkout time is 11:00 AM the day of discharge.   Patients discharged the day of surgery will not be allowed to drive  home.  Name and phone number of your driver: family  Special Instructions: Shower using CHG 2 nights before surgery and the night before surgery.  If you shower the day of surgery use CHG.  Use special wash - you have one bottle of CHG for all showers.  You should use approximately 1/3 of the bottle for each shower.   Please read over the following fact sheets that you were given: Pain Booklet, Coughing and Deep Breathing, Blood Transfusion Information, Lab Information, Total Joint Packet, MRSA Information, Surgical Site Infection Prevention, Anesthesia Post-op Instructions and Care and Recovery After Surgery Total Hip Replacement Total hip replacement is the replacement of your damaged hip with an artificial hip joint (prosthetic hip joint). The purpose of this surgery is to reduce pain and improve your hip function. LET YOUR CAREGIVER KNOW ABOUT:   Any allergies you have.  Any medicines you are taking, including vitamins, herbs, eyedrops, over-the-counter medicines, and creams.  Any problems you have had with the use of anesthetics.  Family history of problems with  the use of anesthetics.  Any blood disorders you have, including bleeding problems or clotting problems.  Previous surgeries you have had. RISKS AND COMPLICATIONS Generally, total hip replacement is a safe procedure. However, as with any surgical procedure, complications can occur. Complications associated with total hip replacement both during and after the procedure include:  Infection.  Dislocation (the ball of the hip-joint prosthesis comes out of contact with the socket).  Loosening of the stem connected to the ball or socket.  Fracture of the bone while inserting the prosthesis.  Formation of blood clots, which can break loose and travel to and injure your lungs (pulmonary embolus). BEFORE THE PROCEDURE   Your caregiver will instruct you when you need to stop eating and drinking.  Ask your caregiver if you need to change or stop any regular medicines. PROCEDURE Just before the procedure, you will receive medicine that will make you drowsy (sedative) or medicine to make you fall asleep (general anesthetic). This will be given through a tube that is inserted into one of your veins (intravenous [IV] tube). Then you will receive medicine to block pain from the waist down through your legs (spinal block). An incision is made in your hip. Your surgeon will take out any damaged cartilage and bone. Next, your surgeon will insert a prosthetic socket into your pelvic bone. This is usually secured with screws. Then, your surgeon will cut off the ball of your thigh bone (femur) and  attach a prosthetic ball on a stem to your femur. The surgeon then places the ball into the socket and checks the range of motion of your new hip. AFTER THE PROCEDURE  You will be taken to the recovery area where a nurse will watch and check your progress. Once you are awake and stable, you will be taken to a hospital room. You will receive physical therapy until you are doing well and your caregiver feels it is safe  for you to go home. Typically, you will stay in the hospital 1 4 days after your procedure. Document Released: 09/05/2000 Document Revised: 11/29/2011 Document Reviewed: 07/17/2011 [facility] Patient Information 2013 [place], [place]. PATIENT INSTRUCTIONS POST-ANESTHESIA  IMMEDIATELY FOLLOWING SURGERY:  Do not drive or operate machinery for the first twenty four hours after surgery.  Do not make any important decisions for twenty four hours after surgery or while taking narcotic pain medications or sedatives.  If you develop intractable nausea and vomiting or a severe headache please notify your doctor immediately.  FOLLOW-UP:  Please make an appointment with your surgeon as instructed. You do not need to follow up with anesthesia unless specifically instructed to do so.  WOUND CARE INSTRUCTIONS (if applicable):  Keep a dry clean dressing on the anesthesia/puncture wound site if there is drainage.  Once the wound has quit draining you may leave it open to air.  Generally you should leave the bandage intact for twenty four hours unless there is drainage.  If the epidural site drains for more than 36-48 hours please call the anesthesia department.  QUESTIONS?:  Please feel free to call your physician or the hospital operator if you have any questions, and they will be happy to assist you.

## 2012-07-31 ENCOUNTER — Encounter (HOSPITAL_COMMUNITY): Payer: Self-pay

## 2012-07-31 ENCOUNTER — Encounter (HOSPITAL_COMMUNITY)
Admission: RE | Admit: 2012-07-31 | Discharge: 2012-07-31 | Disposition: A | Discharge status: Home / Self Care | Payer: Medicare Other | Source: Ambulatory Visit | Attending: Orthopedic Surgery | Admitting: Orthopedic Surgery

## 2012-07-31 HISTORY — DX: Anemia, unspecified: D64.9

## 2012-07-31 LAB — BASIC METABOLIC PANEL
GFR calc Af Amer: 72 mL/min — ABNORMAL LOW (ref >90)
GFR calc non Af Amer: 62 mL/min — ABNORMAL LOW (ref >90)
Potassium: 5 mEq/L (ref 3.5–5.1)
Sodium: 138 mEq/L (ref 135–145)

## 2012-07-31 LAB — SURGICAL PCR SCREEN
MRSA, PCR: NEGATIVE
Staphylococcus aureus: NEGATIVE

## 2012-07-31 LAB — CBC
MCHC: 34.2 g/dL (ref 30.0–36.0)
RDW: 12.9 % (ref 11.5–15.5)
WBC: 7.8 10*3/uL (ref 4.0–10.5)

## 2012-07-31 NOTE — Telephone Encounter (Signed)
Per [employer] system status note, accessed 07/30/12, approximately 6:30p.m, Status of pre-authorization notification updated, following review of clinicals faxed to nurse reviewer at fax#[phone]: Note:  "Covered/Approved" with same prior authorization #[id number] for admit date 08/06/12.

## 2012-08-05 ENCOUNTER — Encounter (HOSPITAL_COMMUNITY): Payer: Self-pay | Admitting: Orthopedic Surgery

## 2012-08-05 NOTE — H&P (Addendum)
TOTAL HIP ADMISSION H&P  Patient is admitted for right total hip arthroplasty.  Subjective:  Chief Complaint: right hip pain  HPI: Terry Herman, 67 y.o. male, has a history of pain and functional disability in the right hip(s) due to arthritis and AND avn and patient has failed non-surgical conservative treatments for greater than 12 weeks to include NSAID's and/or analgesics, flexibility and strengthening excercises, use of assistive devices and activity modification.  Onset of symptoms was gradual starting 6 years ago with gradually worsening course since that time.The patient noted no past surgery on the right hip(s).  Patient currently rates pain in the right hip at 8 out of 10 with activity. Patient has night pain, worsening of pain with activity and weight bearing, pain that interfers with activities of daily living, pain with passive range of motion and crepitus. Patient has evidence of subchondral cysts, subchondral sclerosis, periarticular osteophytes, joint space narrowing and AVN by imaging studies. This condition presents safety issues increasing the risk of falls. This patient has had NO PRIOR SURGERY ON THIS HIP .  There is no current active infection.  Patient Active Problem List   Diagnosis Date Noted  . Stiffness of hip joint 05/08/2012  . Encounter for screening colonoscopy 04/04/2012  . AVN (avascular necrosis of bone) 01/25/2012  . OA (osteoarthritis) of hip 01/25/2012   Past Medical History  Diagnosis Date  . Hypertension   . Hypercholesterolemia   . Hip pain     s/p left hip surgery Nov 4th with Dr. Romeo Apple  . Shortness of breath     exertion  . Arthritis   . Anemia     Past Surgical History  Procedure Laterality Date  . None    . 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 Total hip arthroplasty  04/16/2012    Procedure: TOTAL HIP ARTHROPLASTY;  Surgeon: Vickki Hearing, MD;  Location: [facility];  Service: Orthopedics;  Laterality: Left;  . Colonoscopy with  propofol  06/26/2012    Procedure: COLONOSCOPY WITH PROPOFOL;  Surgeon: West Bali, MD;  Location: [facility];  Service: Endoscopy;  Laterality: N/A;  At cecum 0916, withdrawal time: 10 minutes, procedure end @ 0926  . Esophagogastroduodenoscopy (egd) with propofol  06/26/2012    Procedure: ESOPHAGOGASTRODUODENOSCOPY (EGD) WITH PROPOFOL;  Surgeon: West Bali, MD;  Location: [facility];  Service: Endoscopy;  Laterality: N/A;  procedure start @ 0932  . Esophageal biopsy  06/26/2012    Procedure: BIOPSY;  Surgeon: West Bali, MD;  Location: [facility];  Service: Endoscopy;  Laterality: N/A;  gastric biopsy  . Joint replacement Left 2013    LEFT THA     No prescriptions prior to admission   No Known Allergies  History  Substance Use Topics  . Smoking status: Current Every Day Smoker -- 1.00 packs/day for 50 years    Types: Cigarettes  . Smokeless tobacco: Not on file  . Alcohol Use: No     Comment: No ETOH in past 5 years, was previously an alcoholic.     Family History  Problem Relation Age of Onset  . Arthritis    . Cancer    . Diabetes    . Colon cancer Neg Hx      Review of Systems  Constitutional: Positive for weight loss.       WORK UP NEGATIVE   HENT: Negative.   Eyes: Negative.   Respiratory: Positive for cough.   Cardiovascular: Negative.   Gastrointestinal: Negative.  Genitourinary: Negative.   Musculoskeletal: Positive for joint pain.  Skin: Negative.   Neurological: Negative.   Endo/Heme/Allergies: Bruises/bleeds easily.  Psychiatric/Behavioral: Negative.     Objective:  Physical Exam  Nursing note and vitals reviewed. Constitutional: He is oriented to person, place, and time. He appears well-developed and well-nourished.  ECTOMORPH BODY HABITUS  HENT:  Head: Normocephalic.  Eyes: Pupils are equal, round, and reactive to light. Right eye exhibits no discharge. Left eye exhibits no discharge. No scleral icterus.  Neck: Normal range of motion. Neck supple. No  JVD present. No tracheal deviation present. No thyromegaly present.  Cardiovascular: Normal rate and intact distal pulses.   Respiratory: Effort normal. No stridor.  GI: Soft. He exhibits no distension.  Musculoskeletal:       Right hip: He exhibits decreased range of motion, tenderness and bony tenderness. He exhibits normal strength.       Right knee: Normal.       Left knee: Normal.       Right ankle: Normal.       Left ankle: Normal.       Legs: Upper extremity exam  The right and left upper extremity:  Inspection and palpation revealed no abnormalities in the upper extremities.  Range of motion is full without contracture. Motor exam is normal with grade 5 strength. The joints are fully reduced without subluxation. There is no atrophy or tremor and muscle tone is normal.  All joints are stable.    LEFT HIP: NORMAL ROM NO ATROPHY, CONTRACTURE TENDERNESS OR INSTABILITY  Lymphadenopathy:    He has no cervical adenopathy.  Neurological: He is alert and oriented to person, place, and time. He has normal reflexes. He displays normal reflexes. He exhibits normal muscle tone. Coordination normal.  Skin: Skin is warm and dry. No rash noted. No erythema. No pallor.  Psychiatric: He has a normal mood and affect. His behavior is normal. Judgment and thought content normal.    Vital signs in last 24 hours:    Labs:   Estimated body mass index is 16.60 kg/(m^2) as calculated from the following:   Height as of 06/19/12: 5\' 11"  (1.803 m).   Weight as of 07/11/12: 53.978 kg (119 lb).   Imaging Review Plain radiographs demonstrate severe degenerative joint disease of the right hip(s). The bone quality appears to be good for age and reported activity level.  Assessment/Plan:  End stage arthritis, right hip(s)  The patient history, physical examination, clinical judgement of the provider and imaging studies are consistent with end stage degenerative joint disease of the right hip(s) and  total hip arthroplasty is deemed medically necessary. The treatment options including medical management, injection therapy, arthroscopy and arthroplasty were discussed at length. The risks and benefits of total hip arthroplasty were presented and reviewed. The risks due to aseptic loosening, infection, stiffness, dislocation/subluxation,  thromboembolic complications and other imponderables were discussed.  The patient acknowledged the explanation, agreed to proceed with the plan and consent was signed. Patient is being admitted for inpatient treatment for surgery, pain control, PT, OT, prophylactic antibiotics, VTE prophylaxis, progressive ambulation and ADL's and discharge planning.The patient is planning to be discharged home with home health services   PLANNED PROCEDURE RIGHT TOTAL HIP

## 2012-08-06 ENCOUNTER — Inpatient Hospital Stay (HOSPITAL_COMMUNITY): Payer: Medicare Other | Admitting: Anesthesiology

## 2012-08-06 ENCOUNTER — Inpatient Hospital Stay (HOSPITAL_COMMUNITY): Payer: Medicare Other

## 2012-08-06 ENCOUNTER — Encounter (HOSPITAL_COMMUNITY): Payer: Self-pay | Admitting: Anesthesiology

## 2012-08-06 ENCOUNTER — Inpatient Hospital Stay (HOSPITAL_COMMUNITY)
Admission: RE | Admit: 2012-08-06 | Discharge: 2012-08-09 | DRG: 470 | Disposition: A | Discharge status: Home Health | Payer: Medicare Other | Source: Ambulatory Visit | Attending: Orthopedic Surgery | Admitting: Orthopedic Surgery

## 2012-08-06 ENCOUNTER — Encounter (HOSPITAL_COMMUNITY)
Admission: RE | Disposition: A | Discharge status: Home Health | Payer: Self-pay | Source: Ambulatory Visit | Attending: Orthopedic Surgery

## 2012-08-06 ENCOUNTER — Encounter (HOSPITAL_COMMUNITY): Payer: Self-pay | Admitting: *Deleted

## 2012-08-06 DIAGNOSIS — E7800 Pure hypercholesterolemia, unspecified: Secondary | ICD-10-CM | POA: Diagnosis present

## 2012-08-06 DIAGNOSIS — I10 Essential (primary) hypertension: Secondary | ICD-10-CM | POA: Diagnosis present

## 2012-08-06 DIAGNOSIS — M8708 Idiopathic aseptic necrosis of bone, other site: Principal | ICD-10-CM | POA: Diagnosis present

## 2012-08-06 DIAGNOSIS — D649 Anemia, unspecified: Secondary | ICD-10-CM | POA: Diagnosis present

## 2012-08-06 DIAGNOSIS — M1610 Unilateral primary osteoarthritis, unspecified hip: Secondary | ICD-10-CM | POA: Diagnosis present

## 2012-08-06 DIAGNOSIS — M129 Arthropathy, unspecified: Secondary | ICD-10-CM | POA: Diagnosis present

## 2012-08-06 HISTORY — PX: TOTAL HIP ARTHROPLASTY: SHX124

## 2012-08-06 LAB — CBC
MCH: 31.4 pg (ref 26.0–34.0)
MCHC: 33.6 g/dL (ref 30.0–36.0)
Platelets: 246 10*3/uL (ref 150–400)
RDW: 12.9 % (ref 11.5–15.5)

## 2012-08-06 LAB — CREATININE, SERUM
Creatinine, Ser: 0.96 mg/dL (ref 0.50–1.35)
GFR calc non Af Amer: 85 mL/min — ABNORMAL LOW (ref >90)

## 2012-08-06 SURGERY — ARTHROPLASTY, HIP, TOTAL,POSTERIOR APPROACH
Anesthesia: Spinal | Site: Hip | Laterality: Right | Wound class: Clean

## 2012-08-06 MED ORDER — STERILE WATER FOR IRRIGATION IR SOLN
Status: DC | PRN
  Administered 2012-08-06: 2000 mL

## 2012-08-06 MED ORDER — MAGNESIUM CITRATE PO SOLN: 1.0000 | Freq: Once | ORAL | Status: AC | PRN

## 2012-08-06 MED ORDER — FENTANYL CITRATE 0.05 MG/ML IJ SOLN: 25.0000 ug | INTRAMUSCULAR | Status: DC | PRN

## 2012-08-06 MED ORDER — CEFAZOLIN SODIUM-DEXTROSE 2-3 GM-% IV SOLR
INTRAVENOUS | Status: AC
  Filled 2012-08-06: qty 50

## 2012-08-06 MED ORDER — EPHEDRINE SULFATE 50 MG/ML IJ SOLN
INTRAMUSCULAR | Status: DC | PRN
  Administered 2012-08-06: 5 mg via INTRAVENOUS
  Administered 2012-08-06: 10 mg via INTRAVENOUS

## 2012-08-06 MED ORDER — SENNA 8.6 MG PO TABS
1.0000 | ORAL_TABLET | Freq: Two times a day (BID) | ORAL | Status: DC
  Administered 2012-08-06 – 2012-08-08 (×5): 8.6 mg via ORAL
  Filled 2012-08-06 (×5): qty 1

## 2012-08-06 MED ORDER — DEXTROSE 5 % IV SOLN
500.0000 mg | Freq: Once | INTRAVENOUS | Status: AC
  Administered 2012-08-06: 500 mg via INTRAVENOUS
  Filled 2012-08-06: qty 5

## 2012-08-06 MED ORDER — PROPOFOL 10 MG/ML IV EMUL
INTRAVENOUS | Status: AC
  Filled 2012-08-06: qty 20

## 2012-08-06 MED ORDER — HYDROMORPHONE HCL PF 1 MG/ML IJ SOLN
0.5000 mg | INTRAMUSCULAR | Status: DC | PRN
  Administered 2012-08-07 – 2012-08-08 (×3): 0.5 mg via INTRAVENOUS
  Filled 2012-08-06 (×3): qty 1

## 2012-08-06 MED ORDER — CELECOXIB 100 MG PO CAPS
400.0000 mg | ORAL_CAPSULE | Freq: Once | ORAL | Status: AC
  Administered 2012-08-06: 400 mg via ORAL

## 2012-08-06 MED ORDER — ACETAMINOPHEN 10 MG/ML IV SOLN
1000.0000 mg | Freq: Once | INTRAVENOUS | Status: AC
  Administered 2012-08-06: 1000 mg via INTRAVENOUS

## 2012-08-06 MED ORDER — BUPIVACAINE IN DEXTROSE 0.75-8.25 % IT SOLN
INTRATHECAL | Status: DC | PRN
  Administered 2012-08-06: 15 mg via INTRATHECAL

## 2012-08-06 MED ORDER — PREGABALIN 50 MG PO CAPS
50.0000 mg | ORAL_CAPSULE | Freq: Once | ORAL | Status: AC
  Administered 2012-08-06: 50 mg via ORAL

## 2012-08-06 MED ORDER — AMLODIPINE BESYLATE 5 MG PO TABS: 15.0000 mg | ORAL_TABLET | Freq: Every day | ORAL | Status: DC

## 2012-08-06 MED ORDER — CEFAZOLIN SODIUM-DEXTROSE 2-3 GM-% IV SOLR: 2.0000 g | INTRAVENOUS | Status: DC

## 2012-08-06 MED ORDER — SIMVASTATIN 10 MG PO TABS
5.0000 mg | ORAL_TABLET | Freq: Every day | ORAL | Status: DC
  Administered 2012-08-06 – 2012-08-08 (×3): 5 mg via ORAL
  Filled 2012-08-06: qty 2
  Filled 2012-08-06 (×2): qty 1

## 2012-08-06 MED ORDER — CHLORHEXIDINE GLUCONATE 4 % EX LIQD: 60.0000 mL | Freq: Once | CUTANEOUS | Status: DC

## 2012-08-06 MED ORDER — ONDANSETRON HCL 4 MG PO TABS: 4.0000 mg | ORAL_TABLET | Freq: Four times a day (QID) | ORAL | Status: DC | PRN

## 2012-08-06 MED ORDER — BUPIVACAINE-EPINEPHRINE PF 0.5-1:200000 % IJ SOLN
INTRAMUSCULAR | Status: AC
  Filled 2012-08-06: qty 30

## 2012-08-06 MED ORDER — BUPIVACAINE-EPINEPHRINE PF 0.5-1:200000 % IJ SOLN
INTRAMUSCULAR | Status: DC | PRN
  Administered 2012-08-06: 60 mL

## 2012-08-06 MED ORDER — SENNOSIDES-DOCUSATE SODIUM 8.6-50 MG PO TABS: 1.0000 | ORAL_TABLET | Freq: Every evening | ORAL | Status: DC | PRN

## 2012-08-06 MED ORDER — PANTOPRAZOLE SODIUM 40 MG PO TBEC
40.0000 mg | DELAYED_RELEASE_TABLET | Freq: Every day | ORAL | Status: DC
  Administered 2012-08-06 – 2012-08-08 (×4): 40 mg via ORAL
  Filled 2012-08-06 (×3): qty 1

## 2012-08-06 MED ORDER — ONDANSETRON HCL 4 MG/2ML IJ SOLN
4.0000 mg | Freq: Once | INTRAMUSCULAR | Status: AC
  Administered 2012-08-06: 4 mg via INTRAVENOUS

## 2012-08-06 MED ORDER — PROPOFOL INFUSION 10 MG/ML OPTIME
INTRAVENOUS | Status: DC | PRN
  Administered 2012-08-06: 35 ug/kg/min via INTRAVENOUS
  Administered 2012-08-06: 25 ug/kg/min via INTRAVENOUS

## 2012-08-06 MED ORDER — FENTANYL CITRATE 0.05 MG/ML IJ SOLN
INTRAMUSCULAR | Status: DC | PRN
  Administered 2012-08-06: 25 ug via INTRAVENOUS
  Administered 2012-08-06: 50 ug via INTRAVENOUS

## 2012-08-06 MED ORDER — OXYCODONE HCL 5 MG PO TABS
ORAL_TABLET | ORAL | Status: AC
  Filled 2012-08-06: qty 1

## 2012-08-06 MED ORDER — SODIUM CHLORIDE 0.9 % IR SOLN
Status: DC | PRN
  Administered 2012-08-06: 3000 mL

## 2012-08-06 MED ORDER — ENOXAPARIN SODIUM 40 MG/0.4ML ~~LOC~~ SOLN: 40.0000 mg | SUBCUTANEOUS | Status: DC

## 2012-08-06 MED ORDER — NICOTINE 7 MG/24HR TD PT24
7.0000 mg | MEDICATED_PATCH | Freq: Every day | TRANSDERMAL | Status: DC
  Administered 2012-08-06 – 2012-08-08 (×3): 7 mg via TRANSDERMAL
  Filled 2012-08-06 (×5): qty 1

## 2012-08-06 MED ORDER — SENNOSIDES-DOCUSATE SODIUM 8.6-50 MG PO TABS: 1.0000 | ORAL_TABLET | Freq: Every day | ORAL | Status: DC

## 2012-08-06 MED ORDER — AMLODIPINE BESYLATE 5 MG PO TABS: 5.0000 mg | ORAL_TABLET | Freq: Every day | ORAL | Status: DC

## 2012-08-06 MED ORDER — LACTATED RINGERS IV SOLN
INTRAVENOUS | Status: DC
  Administered 2012-08-06 – 2012-08-07 (×4): via INTRAVENOUS

## 2012-08-06 MED ORDER — BISACODYL 5 MG PO TBEC: 5.0000 mg | DELAYED_RELEASE_TABLET | Freq: Every day | ORAL | Status: DC | PRN

## 2012-08-06 MED ORDER — METOCLOPRAMIDE HCL 5 MG/ML IJ SOLN: 5.0000 mg | Freq: Three times a day (TID) | INTRAMUSCULAR | Status: DC | PRN

## 2012-08-06 MED ORDER — CELECOXIB 100 MG PO CAPS
ORAL_CAPSULE | ORAL | Status: AC
  Filled 2012-08-06: qty 4

## 2012-08-06 MED ORDER — SODIUM CHLORIDE 0.9 % IR SOLN
Status: DC | PRN
  Administered 2012-08-06: 1000 mL

## 2012-08-06 MED ORDER — MIDAZOLAM HCL 2 MG/2ML IJ SOLN
1.0000 mg | INTRAMUSCULAR | Status: DC | PRN
  Administered 2012-08-06: 2 mg via INTRAVENOUS

## 2012-08-06 MED ORDER — OXYCODONE HCL 5 MG PO TABS
5.0000 mg | ORAL_TABLET | ORAL | Status: DC
  Administered 2012-08-06 – 2012-08-07 (×4): 5 mg via ORAL
  Filled 2012-08-06 (×4): qty 1

## 2012-08-06 MED ORDER — AMLODIPINE BESYLATE 5 MG PO TABS
15.0000 mg | ORAL_TABLET | Freq: Every day | ORAL | Status: DC
  Administered 2012-08-07: 15 mg via ORAL
  Filled 2012-08-06 (×2): qty 3

## 2012-08-06 MED ORDER — BUPIVACAINE IN DEXTROSE 0.75-8.25 % IT SOLN
INTRATHECAL | Status: AC
  Filled 2012-08-06: qty 2

## 2012-08-06 MED ORDER — ONDANSETRON HCL 4 MG/2ML IJ SOLN: 4.0000 mg | Freq: Once | INTRAMUSCULAR | Status: DC | PRN

## 2012-08-06 MED ORDER — ACETAMINOPHEN 10 MG/ML IV SOLN
INTRAVENOUS | Status: AC
  Filled 2012-08-06: qty 100

## 2012-08-06 MED ORDER — MIDAZOLAM HCL 5 MG/5ML IJ SOLN
INTRAMUSCULAR | Status: DC | PRN
  Administered 2012-08-06 (×4): 1 mg via INTRAVENOUS

## 2012-08-06 MED ORDER — DOCUSATE SODIUM 100 MG PO CAPS
100.0000 mg | ORAL_CAPSULE | Freq: Two times a day (BID) | ORAL | Status: DC
  Administered 2012-08-06 – 2012-08-08 (×5): 100 mg via ORAL
  Filled 2012-08-06 (×5): qty 1

## 2012-08-06 MED ORDER — MIDAZOLAM HCL 2 MG/2ML IJ SOLN
INTRAMUSCULAR | Status: AC
  Filled 2012-08-06: qty 2

## 2012-08-06 MED ORDER — PREGABALIN 50 MG PO CAPS
ORAL_CAPSULE | ORAL | Status: AC
  Filled 2012-08-06: qty 1

## 2012-08-06 MED ORDER — OXYCODONE HCL 5 MG PO TABS
5.0000 mg | ORAL_TABLET | Freq: Once | ORAL | Status: AC
  Administered 2012-08-06: 5 mg via ORAL

## 2012-08-06 MED ORDER — METHOCARBAMOL 100 MG/ML IJ SOLN
500.0000 mg | Freq: Four times a day (QID) | INTRAVENOUS | Status: DC
  Filled 2012-08-06 (×13): qty 5

## 2012-08-06 MED ORDER — CEFAZOLIN SODIUM-DEXTROSE 2-3 GM-% IV SOLR
INTRAVENOUS | Status: DC | PRN
  Administered 2012-08-06: 2 g via INTRAVENOUS

## 2012-08-06 MED ORDER — ONDANSETRON HCL 4 MG/2ML IJ SOLN
INTRAMUSCULAR | Status: AC
  Filled 2012-08-06: qty 2

## 2012-08-06 MED ORDER — ONDANSETRON HCL 4 MG/2ML IJ SOLN: 4.0000 mg | Freq: Four times a day (QID) | INTRAMUSCULAR | Status: DC | PRN

## 2012-08-06 MED ORDER — CELECOXIB 100 MG PO CAPS
200.0000 mg | ORAL_CAPSULE | Freq: Two times a day (BID) | ORAL | Status: DC
  Administered 2012-08-07 – 2012-08-08 (×4): 200 mg via ORAL
  Filled 2012-08-06 (×4): qty 2

## 2012-08-06 MED ORDER — PHENOL 1.4 % MT LIQD: 1.0000 | OROMUCOSAL | Status: DC | PRN

## 2012-08-06 MED ORDER — METOCLOPRAMIDE HCL 10 MG PO TABS: 5.0000 mg | ORAL_TABLET | Freq: Three times a day (TID) | ORAL | Status: DC | PRN

## 2012-08-06 MED ORDER — CEFAZOLIN SODIUM 1-5 GM-% IV SOLN
1.0000 g | Freq: Four times a day (QID) | INTRAVENOUS | Status: AC
  Administered 2012-08-06 (×2): 1 g via INTRAVENOUS
  Filled 2012-08-06 (×2): qty 50

## 2012-08-06 MED ORDER — FENTANYL CITRATE 0.05 MG/ML IJ SOLN
INTRAMUSCULAR | Status: AC
  Filled 2012-08-06: qty 2

## 2012-08-06 MED ORDER — AMLODIPINE BESYLATE 5 MG PO TABS: 10.0000 mg | ORAL_TABLET | Freq: Every day | ORAL | Status: DC

## 2012-08-06 MED ORDER — FERROUS SULFATE 325 (65 FE) MG PO TABS
325.0000 mg | ORAL_TABLET | Freq: Every day | ORAL | Status: DC
  Administered 2012-08-06: 325 mg via ORAL
  Filled 2012-08-06: qty 1

## 2012-08-06 MED ORDER — LACTATED RINGERS IV SOLN
INTRAVENOUS | Status: DC
  Administered 2012-08-06 (×2): via INTRAVENOUS

## 2012-08-06 MED ORDER — METHOCARBAMOL 500 MG PO TABS
500.0000 mg | ORAL_TABLET | Freq: Four times a day (QID) | ORAL | Status: DC
  Administered 2012-08-06 – 2012-08-08 (×10): 500 mg via ORAL
  Filled 2012-08-06 (×11): qty 1

## 2012-08-06 MED ORDER — FENTANYL CITRATE 0.05 MG/ML IJ SOLN
25.0000 ug | INTRAMUSCULAR | Status: DC | PRN
  Administered 2012-08-06: 25 ug via INTRAVENOUS

## 2012-08-06 MED ORDER — FENTANYL CITRATE 0.05 MG/ML IJ SOLN
INTRAMUSCULAR | Status: DC | PRN
  Administered 2012-08-06: 25 ug via INTRAVENOUS

## 2012-08-06 MED ORDER — MENTHOL 3 MG MT LOZG: 1.0000 | LOZENGE | OROMUCOSAL | Status: DC | PRN

## 2012-08-06 SURGICAL SUPPLY — 68 items
BIT DRILL 2.8X128 (BIT) ×2 IMPLANT
BLADE HEX COATED 2.75 (ELECTRODE) ×2 IMPLANT
BLADE SAGITTAL 25.0X1.27X90 (BLADE) ×2 IMPLANT
BRUSH FEMORAL CANAL (MISCELLANEOUS) IMPLANT
CATH KIT ON Q 2.5IN SLV (PAIN MANAGEMENT) IMPLANT
CHLORAPREP W/TINT 26ML (MISCELLANEOUS) ×4 IMPLANT
CLOTH BEACON ORANGE TIMEOUT ST (SAFETY) ×2 IMPLANT
COVER LIGHT HANDLE STERIS (MISCELLANEOUS) ×4 IMPLANT
COVER PROBE W GEL 5X96 (DRAPES) ×2 IMPLANT
DECANTER SPIKE VIAL GLASS SM (MISCELLANEOUS) ×4 IMPLANT
DRAPE BACK TABLE (DRAPES) ×2 IMPLANT
DRAPE HIP W/POCKET STRL (DRAPE) ×2 IMPLANT
DRAPE INCISE IOBAN 44X35 STRL (DRAPES) ×1 IMPLANT
DRAPE INCISE IOBAN 66X45 STRL (DRAPES) ×1 IMPLANT
DRAPE U-SHAPE 47X51 STRL (DRAPES) ×2 IMPLANT
DRSG MEPILEX BORDER 4X12 (GAUZE/BANDAGES/DRESSINGS) ×2 IMPLANT
ELECT REM PT RETURN 9FT ADLT (ELECTROSURGICAL) ×2
ELECTRODE REM PT RTRN 9FT ADLT (ELECTROSURGICAL) ×1 IMPLANT
FACESHIELD OPICON STD (MASK) ×2 IMPLANT
GLOVE BIOGEL PI IND STRL 7.0 (GLOVE) IMPLANT
GLOVE BIOGEL PI IND STRL 8 (GLOVE) IMPLANT
GLOVE BIOGEL PI INDICATOR 7.0 (GLOVE) ×3
GLOVE BIOGEL PI INDICATOR 8 (GLOVE)
GLOVE ECLIPSE 6.5 STRL STRAW (GLOVE) ×1 IMPLANT
GLOVE EXAM NITRILE PF LG BLUE (GLOVE) ×2 IMPLANT
GLOVE OPTIFIT SS 8.0 STRL (GLOVE) ×2 IMPLANT
GLOVE SKINSENSE NS SZ8.0 LF (GLOVE) ×2
GLOVE SKINSENSE STRL SZ8.0 LF (GLOVE) ×2 IMPLANT
GLOVE SS BIOGEL STRL SZ 6.5 (GLOVE) IMPLANT
GLOVE SS BIOGEL STRL SZ 8 (GLOVE) IMPLANT
GLOVE SS N UNI LF 8.5 STRL (GLOVE) ×2 IMPLANT
GLOVE SUPERSENSE BIOGEL SZ 6.5 (GLOVE) ×1
GLOVE SUPERSENSE BIOGEL SZ 8 (GLOVE)
GOWN STRL REIN XL XLG (GOWN DISPOSABLE) ×6 IMPLANT
HANDPIECE INTERPULSE COAX TIP (DISPOSABLE) ×2
HOOD W/PEELAWAY (MISCELLANEOUS) ×7 IMPLANT
INST SET MAJOR BONE (KITS) ×2 IMPLANT
IV NS IRRIG 3000ML ARTHROMATIC (IV SOLUTION) ×2 IMPLANT
KIT BLADEGUARD II DBL (SET/KITS/TRAYS/PACK) ×2 IMPLANT
KIT ROOM TURNOVER APOR (KITS) ×2 IMPLANT
MANIFOLD NEPTUNE II (INSTRUMENTS) ×2 IMPLANT
MARKER SKIN DUAL TIP RULER LAB (MISCELLANEOUS) ×2 IMPLANT
NDL HYPO 21X1.5 SAFETY (NEEDLE) ×1 IMPLANT
NEEDLE HYPO 21X1.5 SAFETY (NEEDLE) ×2 IMPLANT
NS IRRIG 1000ML POUR BTL (IV SOLUTION) ×2 IMPLANT
PACK TOTAL JOINT (CUSTOM PROCEDURE TRAY) ×2 IMPLANT
PAD ARMBOARD 7.5X6 YLW CONV (MISCELLANEOUS) ×2 IMPLANT
PASSER SUT SWANSON 36MM LOOP (INSTRUMENTS) ×1 IMPLANT
PILLOW HIP ABDUCTION MED (ORTHOPEDIC SUPPLIES) ×1 IMPLANT
PIN STMN 9X.142 IN (PIN) ×4 IMPLANT
SET BASIN LINEN APH (SET/KITS/TRAYS/PACK) ×2 IMPLANT
SET HNDPC FAN SPRY TIP SCT (DISPOSABLE) ×1 IMPLANT
SPONGE LAP 18X18 X RAY DECT (DISPOSABLE) ×3 IMPLANT
STAPLER VISISTAT 35W (STAPLE) ×2 IMPLANT
SUT BRALON NAB BRD #1 30IN (SUTURE) ×6 IMPLANT
SUT ETHIBOND 5 LR DA (SUTURE) ×4 IMPLANT
SUT MNCRL 0 VIOLET CTX 36 (SUTURE) ×1 IMPLANT
SUT MON AB 2-0 CT1 36 (SUTURE) ×1 IMPLANT
SUT MONOCRYL 0 CTX 36 (SUTURE) ×2
SUT VIC AB 1 CT1 27 (SUTURE) ×4
SUT VIC AB 1 CT1 27XBRD ANTBC (SUTURE) ×2 IMPLANT
SYR 30ML LL (SYRINGE) ×2 IMPLANT
SYR BULB IRRIGATION 50ML (SYRINGE) ×2 IMPLANT
TOWEL OR 17X26 4PK STRL BLUE (TOWEL DISPOSABLE) ×2 IMPLANT
TOWER CARTRIDGE SMART MIX (DISPOSABLE) IMPLANT
TRAY FOLEY CATH 14FR (SET/KITS/TRAYS/PACK) ×2 IMPLANT
WATER STERILE IRR 1000ML POUR (IV SOLUTION) ×2 IMPLANT
YANKAUER SUCT 12FT TUBE ARGYLE (SUCTIONS) ×2 IMPLANT

## 2012-08-06 NOTE — Anesthesia Preprocedure Evaluation (Signed)
Anesthesia Evaluation  Patient identified by MRN, date of birth, ID band Patient awake    Reviewed: Allergy & Precautions, H&P , NPO status , Patient's Chart, lab work & pertinent test results  Airway Mallampati: I      Dental  (+) Edentulous Upper and Edentulous Lower   Pulmonary shortness of breath and with exertion, COPDCurrent Smoker (75 PY),  + rhonchi         Cardiovascular hypertension, Pt. on medications Rhythm:Regular Rate:Normal     Neuro/Psych    GI/Hepatic negative GI ROS,   Endo/Other    Renal/GU      Musculoskeletal   Abdominal   Peds  Hematology   Anesthesia Other Findings   Reproductive/Obstetrics                           Anesthesia Physical Anesthesia Plan  ASA: III  Anesthesia Plan: Spinal   Post-op Pain Management:    Induction: Intravenous  Airway Management Planned: Nasal Cannula  Additional Equipment:   Intra-op Plan:   Post-operative Plan:   Informed Consent: I have reviewed the patients History and Physical, chart, labs and discussed the procedure including the risks, benefits and alternatives for the proposed anesthesia with the patient or authorized representative who has indicated his/her understanding and acceptance.     Plan Discussed with:   Anesthesia Plan Comments:         Anesthesia Quick Evaluation

## 2012-08-06 NOTE — Interval H&P Note (Signed)
History and Physical Interval Note:  08/06/2012 10:15 AM  Terry Herman  has presented today for surgery, with the diagnosis of avascular necrosis of bone right hip  The various methods of treatment have been discussed with the patient and family. After consideration of risks, benefits and other options for treatment, the patient has consented to  Procedure(s) with comments: TOTAL HIP ARTHROPLASTY (Right) - [employer] as a surgical intervention .  The patient's history has been reviewed, patient examined, no change in status, stable for surgery.  I have reviewed the patient's chart and labs.  Questions were answered to the patient's satisfaction.     Fuller [place]

## 2012-08-06 NOTE — Brief Op Note (Addendum)
08/06/2012  1:16 PM  PATIENT:  Terry Herman  66 y.o. male  PRE-OPERATIVE DIAGNOSIS:  avascular necrosis of bone right hip  POST-OPERATIVE DIAGNOSIS:  avascular necrosis of bone right hip  Operative findings severe deformity of the femoral head with delamination of the cartilage. Severe cartilage erosion acetabulum and femoral head and multiple osteophytes.  PROCEDURE:  Procedure(s): TOTAL HIP ARTHROPLASTY (Right)  [employer]ker  tritanium hemi cup: 54 10 degree poly liner 127 degree accolade II # 6 stem 36 +5 metal head   Surgical details. The patient was identified in the preop area as Terry Herman. Mark was placed on the right hip and countersigned. Chart was reviewed. Patient in the surgical suite. 2 g of Ancef given based on weight. Spinal anesthetic administered. Patient placed lateral to cubitus position right side up. Axillary roll placed. Hip positioner placed. Right leg prepped and draped.  After sterile draping timeout was completed with all members of the surgical team in the operating room.  A lateral incision was made over the trochanter. Simultaneous tissue was divided. The fascial incision follow the contour of the skin incision down to the trochanteric bursa. A  trochanteric bursectomy was performed. The anterior half of the gluteus medius along with the gluteus minimus and contour with the vastus lateralis was subperiosteally dissected from the trochanter and retracted anteriorly and held with 2 Steinmann pins in the pelvis. A capsulectomy followed. The hip was then dislocated. Using the Accolade 2 neck cutting guide the femoral neck cut was made 15 mm above the lesser trochanter.  The box osteotome was used to lateralize into point into the femoral canal. Canal finder followed. Serial broaching followed up to a size 6.  Leg was then placed on the table and acetabular labrum and capsule was removed into the acetabulum was exposed. 2 retractors were placed one anterior one  posterior. Acetabular ligament was identified for orientation.  Serial reaming starting with 43 progressing up to a size 53. A trial 52 and 53 liner were placed and fit nicely into the acetabulum. A 54 cup was placed. Trial reduction was performed starting with a 0/36 femoral head. The hip seemed to dislocate posteriorly so therefore the trials were removed and the cup was repositioned with more anteversion. Along with a 20 posterior liner, 0/36 femoral head 54 cup and 54 liner trial reduction was were repeated and we settled on a +5/36 femoral head with a 54 cup 54 liner size 6, 127 stem. This gave ability in flexion and internal rotation to 60, extension +5 with external rotation 50. Shuck was normal leg lengths were normalized.  Drill holes were placed into the femoral trochanter the real polyethylene liner was placed with the 10 buildup posterior inferior. The stem was then passed. The head was placed. Excellent fit.  The hip was reduced. Repeat trial reduction the hip was stable as described.  The wound was copiously irrigated.  With the hip in internal rotation, #5 Tycron suture passed through the femur was used to repair the abductors and #1 Bralon in interrupted fashion was used to repair the vastus fascia.  The hip was then abducted a #1 Bralon suture in running fashion was used to close the fascia and second irrigation was performed subfascial injection of 30 cc of Marcaine with epinephrine was performed.  Subcutaneous tissues closed with 0 Monocryl in running fashion followed by injection of additional 30 cc of Marcaine with epinephrine. Staples applied. Sterile dressing applied. Patient placed on the regular bed   leg lengths measured equal. He was then placed in  an abduction pillow.    SURGEON:  Surgeon(s) and Role:    * Lashawnda Hancox E Joshus Rogan, MD - Primary  PHYSICIAN ASSISTANT:   ASSISTANTS: betty ashley    ANESTHESIA:   spinal  EBL:  Total I/O In: 1700 [I.V.:1700] Out: 450  [Urine:150; Blood:300]  BLOOD ADMINISTERED:none  DRAINS: none   LOCAL MEDICATIONS USED:  MARCAINE   , Amount: 60 ml and OTHER epi  SPECIMEN:  No Specimen  DISPOSITION OF SPECIMEN:  N/A  COUNTS:  YES  TOURNIQUET:  * No tourniquets in log *  DICTATION: .Dragon Dictation  PLAN OF CARE: Admit to inpatient   PATIENT DISPOSITION:  PACU - hemodynamically stable.   Delay start of Pharmacological VTE agent (>24hrs) due to surgical blood loss or risk of bleeding: yes 

## 2012-08-06 NOTE — Anesthesia Procedure Notes (Signed)
Spinal  Patient location during procedure: OR Start time: 08/06/2012 10:42 AM Staffing CRNA/Resident: Joycelyn Man J Preanesthetic Checklist Completed: patient identified, site marked, surgical consent, pre-op evaluation, timeout performed, IV checked, risks and benefits discussed and monitors and equipment checked Spinal Block Patient position: right lateral decubitus Prep: Betadine Patient monitoring: cardiac monitor, heart rate, continuous pulse ox and blood pressure Approach: midline Location: L2-3 Injection technique: single-shot Needle Needle type: Spinocan  Needle gauge: 22 G Assessment Sensory level: T6 Additional Notes CSF clear and brisk    [id number] 05/2013

## 2012-08-06 NOTE — Transfer of Care (Signed)
Immediate Anesthesia Transfer of Care Note  Patient: Terry Herman  Procedure(s) Performed: Procedure(s): TOTAL HIP ARTHROPLASTY (Right)  Patient Location: PACU  Anesthesia Type:Spinal  Level of Consciousness: awake and patient cooperative  Airway & Oxygen Therapy: Patient Spontanous Breathing and Patient connected to face mask oxygen  Post-op Assessment: Report given to PACU RN and Post -op Vital signs reviewed and stable  Post vital signs: Reviewed and stable  Complications: No apparent anesthesia complications

## 2012-08-06 NOTE — Anesthesia Postprocedure Evaluation (Signed)
  Anesthesia Post-op Note  Patient: Terry Herman  Procedure(s) Performed: Procedure(s): TOTAL HIP ARTHROPLASTY (Right)  Patient Location: PACU  Anesthesia Type:Spinal  Level of Consciousness: awake, alert , oriented and patient cooperative  Airway and Oxygen Therapy: Patient Spontanous Breathing and Patient connected to nasal cannula oxygen  Post-op Pain: 4 /10, mild  Post-op Assessment: Post-op Vital signs reviewed, Patient's Cardiovascular Status Stable, Respiratory Function Stable, Patent Airway, No signs of Nausea or vomiting and Pain level controlled  Post-op Vital Signs: Reviewed and stable  Complications: No apparent anesthesia complications

## 2012-08-06 NOTE — Op Note (Signed)
08/06/2012  1:16 PM  PATIENT:  Terry Herman  67 y.o. male  PRE-OPERATIVE DIAGNOSIS:  avascular necrosis of bone right hip  POST-OPERATIVE DIAGNOSIS:  avascular necrosis of bone right hip  Operative findings severe deformity of the femoral head with delamination of the cartilage. Severe cartilage erosion acetabulum and femoral head and multiple osteophytes.  PROCEDURE:  Procedure(s): TOTAL HIP ARTHROPLASTY (Right)  [employer]ker  tritanium hemi cup: 54 10 degree poly liner 127 degree accolade II # 6 stem 36 +5 metal head   Surgical details. The patient was identified in the preop area as Terry Herman. Terry Herman. Terry Herman was placed on the right hip and countersigned. Chart was reviewed. Patient in the surgical suite. 2 g of Ancef given based on weight. Spinal anesthetic administered. Patient placed lateral to cubitus position right side up. Axillary roll placed. Hip positioner placed. Right leg prepped and draped.  After sterile draping timeout was completed with all members of the surgical team in the operating room.  A lateral incision was made over the trochanter. Simultaneous tissue was divided. The fascial incision follow the contour of the skin incision down to the trochanteric bursa. A  trochanteric bursectomy was performed. The anterior half of the gluteus medius along with the gluteus minimus and contour with the vastus lateralis was subperiosteally dissected from the trochanter and retracted anteriorly and held with 2 Steinmann pins in the pelvis. A capsulectomy followed. The hip was then dislocated. Using the Accolade 2 neck cutting guide the femoral neck cut was made 15 mm above the lesser trochanter.  The box osteotome was used to lateralize into point into the femoral canal. Canal finder followed. Serial broaching followed up to a size 6.  Leg was then placed on the table and acetabular labrum and capsule was removed into the acetabulum was exposed. 2 retractors were placed one anterior one  posterior. Acetabular ligament was identified for orientation.  Serial reaming starting with 43 progressing up to a size 53. A trial 52 and 53 liner were placed and fit nicely into the acetabulum. A 54 cup was placed. Trial reduction was performed starting with a 0/36 femoral head. The hip seemed to dislocate posteriorly so therefore the trials were removed and the cup was repositioned with more anteversion. Along with a 20 posterior liner, 0/36 femoral head 54 cup and 54 liner trial reduction was were repeated and we settled on a +5/36 femoral head with a 54 cup 54 liner size 6, 127 stem. This gave ability in flexion and internal rotation to 60, extension +5 with external rotation 50. Shuck was normal leg lengths were normalized.  Drill holes were placed into the femoral trochanter the real polyethylene liner was placed with the 10 buildup posterior inferior. The stem was then passed. The head was placed. Excellent fit.  The hip was reduced. Repeat trial reduction the hip was stable as described.  The wound was copiously irrigated.  With the hip in internal rotation, #5 Tycron suture passed through the femur was used to repair the abductors and #1 Bralon in interrupted fashion was used to repair the vastus fascia.  The hip was then abducted a #1 Bralon suture in running fashion was used to close the fascia and second irrigation was performed subfascial injection of 30 cc of Marcaine with epinephrine was performed.  Subcutaneous tissues closed with 0 Monocryl in running fashion followed by injection of additional 30 cc of Marcaine with epinephrine. Staples applied. Sterile dressing applied. Patient placed on the regular bed  leg lengths measured equal. He was then placed in  an abduction pillow.    SURGEON:  Surgeon(s) and Role:    * Vickki Hearing, MD - Primary  PHYSICIAN ASSISTANT:   ASSISTANTS: betty ashley    ANESTHESIA:   spinal  EBL:  Total I/O In: 1700 [I.V.:1700] Out: 450  [Urine:150; Blood:300]  BLOOD ADMINISTERED:none  DRAINS: none   LOCAL MEDICATIONS USED:  MARCAINE   , Amount: 60 ml and OTHER epi  SPECIMEN:  No Specimen  DISPOSITION OF SPECIMEN:  N/A  COUNTS:  YES  TOURNIQUET:  * No tourniquets in log *  DICTATION: .Dragon Dictation  PLAN OF CARE: Admit to inpatient   PATIENT DISPOSITION:  PACU - hemodynamically stable.   Delay start of Pharmacological VTE agent (>24hrs) due to surgical blood loss or risk of bleeding: yes

## 2012-08-06 NOTE — Interval H&P Note (Signed)
History and Physical Interval Note:  08/06/2012 10:13 AM  Terry Herman  has presented today for surgery, with the diagnosis of avascular necrosis of bone right hip  The various methods of treatment have been discussed with the patient and family. After consideration of risks, benefits and other options for treatment, the patient has consented to  Procedure(s) with comments: TOTAL HIP ARTHROPLASTY (Right) - [employer] as a surgical intervention .  The patient's history has been reviewed, patient examined, no change in status, stable for surgery.  I have reviewed the patient's chart and labs.  Questions were answered to the patient's satisfaction.    RIGHT TOTAL HIP   Fuller [place]

## 2012-08-06 NOTE — Telephone Encounter (Signed)
08/06/12 Received call from [facility]'s representative, Jel C; confirmed patient's admit date of today for surgery per same notification # noted.

## 2012-08-06 NOTE — Progress Notes (Signed)
Rt pedal pulse palpable. Heels off bed. AP of pelvis done. Denies pain.

## 2012-08-07 ENCOUNTER — Encounter (HOSPITAL_COMMUNITY): Payer: Self-pay | Admitting: Orthopedic Surgery

## 2012-08-07 LAB — BASIC METABOLIC PANEL
Calcium: 8.7 mg/dL (ref 8.4–10.5)
Creatinine, Ser: 0.97 mg/dL (ref 0.50–1.35)
GFR calc non Af Amer: 85 mL/min — ABNORMAL LOW (ref >90)
Sodium: 136 mEq/L (ref 135–145)

## 2012-08-07 LAB — CBC
MCH: 31.4 pg (ref 26.0–34.0)
MCHC: 33.6 g/dL (ref 30.0–36.0)
MCV: 93.5 fL (ref 78.0–100.0)
Platelets: 231 10*3/uL (ref 150–400)

## 2012-08-07 MED ORDER — FERROUS SULFATE 325 (65 FE) MG PO TABS
325.0000 mg | ORAL_TABLET | Freq: Three times a day (TID) | ORAL | Status: DC
  Administered 2012-08-07 – 2012-08-09 (×7): 325 mg via ORAL
  Filled 2012-08-07 (×7): qty 1

## 2012-08-07 MED ORDER — OXYCODONE-ACETAMINOPHEN 5-325 MG PO TABS
1.0000 | ORAL_TABLET | ORAL | Status: DC | PRN
  Administered 2012-08-07: 1 via ORAL
  Filled 2012-08-07: qty 1

## 2012-08-07 MED ORDER — ASPIRIN EC 325 MG PO TBEC
325.0000 mg | DELAYED_RELEASE_TABLET | Freq: Two times a day (BID) | ORAL | Status: DC
  Administered 2012-08-07 – 2012-08-08 (×4): 325 mg via ORAL
  Filled 2012-08-07 (×4): qty 1

## 2012-08-07 NOTE — Progress Notes (Addendum)
Patient ID: Terry Herman, male   DOB: 1945/10/20, 67 y.o.   MRN: [id number] BP 106/70  Pulse 117  Temp(Src) 96 F (35.6 C) (Axillary)  Resp 18  Ht 5\' 11"  (1.803 m)  Wt 122 lb (55.339 kg)  BMI 17.02 kg/m2  SpO2 94%  Status post right total hip arthroplasty  Patient is doing well pain is well controlled.  Hemoglobin 9.7  Minimal thigh swelling. Ice applied  Neurovascular exam intact  Start physical therapy weightbearing as tolerated  Lovenox until hemoglobin stabilizes continue foot pumps and TED stockings, and Ecotrin twice a day.

## 2012-08-07 NOTE — Progress Notes (Signed)
UR Chart Review Completed  

## 2012-08-07 NOTE — Progress Notes (Signed)
Physical Therapy Treatment Patient Details Name: Terry Herman MRN: [id number] DOB: 03-27-1946 Today's Date: 08/07/2012 Time: 4098-1191 PT Time Calculation (min): 26 min  PT Assessment / Plan / Recommendation Comments on Treatment Session  Pt is progressing well, min assist to stand to walker and SBA to ambulate 15'.  Will plan to progress gait distance tomorrow, if possible, and instruct in 2 steps using 2 rails as he has at home.    Follow Up Recommendations  Home health PT     Does the patient have the potential to tolerate intense rehabilitation     Barriers to Discharge None      Equipment Recommendations  None recommended by PT    Recommendations for Other Services OT consult  Frequency 7X/week   Plan Discharge plan remains appropriate;Frequency remains appropriate    Precautions / Restrictions Precautions Precautions: Anterior Hip Precaution Booklet Issued: No Precaution Comments: Pt had L THR done last Nov. so is experienced with protocol Restrictions Weight Bearing Restrictions: No Other Position/Activity Restrictions: WBAT R   Pertinent Vitals/Pain     Mobility  Bed Mobility Bed Mobility: Supine to Sit;Sit to Supine Supine to Sit: 4: Min assist;HOB elevated Sit to Supine: 4: Min assist;HOB flat Transfers Transfers: Sit to Stand;Stand to Sit Sit to Stand: 4: Min assist;With armrests Stand to Sit: 4: Min guard;With upper extremity assist Ambulation/Gait Ambulation/Gait Assistance: 5: Supervision Ambulation Distance (Feet): 15 Feet Assistive device: Rolling walker Gait Pattern: Step-through pattern;Decreased step length - left;Decreased stance time - right Stairs: No Wheelchair Mobility Wheelchair Mobility: No    Exercises Total Joint Exercises Ankle Circles/Pumps: AROM;Both;10 reps;Supine Quad Sets: AROM;Both;Supine;10 reps Gluteal Sets: AROM;Both;10 reps;Supine Short Arc Quad: AAROM;Both;10 reps;Supine Heel Slides: AAROM;Both;10 reps;Supine   PT  Diagnosis: Difficulty walking;Abnormality of gait;Acute pain  PT Problem List: Decreased strength;Decreased range of motion;Decreased activity tolerance;Decreased mobility;Pain PT Treatment Interventions: Gait training;Stair training;Therapeutic activities;Therapeutic exercise;Functional mobility training;Patient/family education   PT Goals Acute Rehab PT Goals PT Goal Formulation: With patient Time For Goal Achievement: 08/14/12 Pt will go Supine/Side to Sit: with modified independence PT Goal: Supine/Side to Sit - Progress: Goal set today Pt will go Sit to Supine/Side: with modified independence PT Goal: Sit to Supine/Side - Progress: Progressing toward goal Pt will go Sit to Stand: with modified independence PT Goal: Sit to Stand - Progress: Progressing toward goal Pt will go Stand to Sit: with modified independence PT Goal: Stand to Sit - Progress: Progressing toward goal Pt will Ambulate: 51 - 150 feet PT Goal: Ambulate - Progress: Progressing toward goal Pt will Go Up / Down Stairs: 3-5 stairs;with supervision;with rail(s) PT Goal: Up/Down Stairs - Progress: Goal set today Pt will Perform Home Exercise Program: with supervision, verbal cues required/provided PT Goal: Perform Home Exercise Program - Progress: Progressing toward goal  Visit Information  Last PT Received On: 08/07/12    Subjective Data  Subjective: I'm getting pretty tired Patient Stated Goal: none stated   Cognition  Cognition Overall Cognitive Status: Appears within functional limits for tasks assessed/performed Arousal/Alertness: Awake/alert Orientation Level: Appears intact for tasks assessed Behavior During Session: [facility] for tasks performed    Balance  Balance Balance Assessed:  (WFL by observation)  End of Session PT - End of Session Equipment Utilized During Treatment: Gait belt Activity Tolerance: Patient tolerated treatment well Patient left: in bed;with call bell/phone within reach Nurse  Communication: Mobility status   GP     Konrad Penta 08/07/2012, 12:03 PM

## 2012-08-07 NOTE — Care Management Note (Signed)
    Page 1 of 2   08/09/2012     9:30:17 AM   CARE MANAGEMENT NOTE 08/09/2012  Patient:  Terry Herman, Terry Herman   Account Number:  [id number]  Date Initiated:  08/07/2012  Documentation initiated by:  Sharrie Rothman  Subjective/Objective Assessment:   Pt admitted from home s/p right hip replacement. Pt lives with his wife and daughter and will return home at discharge. Pt has walker and BSC for home use. Pt has used [facility] in the past and would like to use them again at discharge.     Action/Plan:   Referral given to [facility]. No DME needs noted. No other CM or HH needs noted.   Anticipated DC Date:  08/09/2012   Anticipated DC Plan:  HOME W HOME HEALTH SERVICES      DC Planning Services  CM consult      [facility] Choice  HOME HEALTH   Choice offered to / List presented to:  C-1 Patient        HH arranged  HH-1 RN  HH-2 PT  HH-3 OT      [facility] agency  [facility]   Status of service:  Completed, signed off Medicare Important Message given?  YES (If response is "NO", the following Medicare IM given date fields will be blank) Date Medicare IM given:  08/09/2012 Date Additional Medicare IM given:    Discharge Disposition:  HOME W HOME HEALTH SERVICES  Per UR Regulation:    If discussed at Long Length of Stay Meetings, dates discussed:    Comments:  08/09/12 0925 Arlyss Queen, RN BSN CM Pt discharged home today with [facility]. Alroy Bailiff of [facility] is aware and will collect the pts information from the chart. HH services will start within 48 hours. No DME needs noted. Pt and pts nurse is aware of discharge arrangements.  08/07/12 1202 Arlyss Queen, RN BSN CM

## 2012-08-07 NOTE — Anesthesia Postprocedure Evaluation (Signed)
  Anesthesia Post-op Note  Patient: Terry Herman  Procedure(s) Performed: Procedure(s): TOTAL HIP ARTHROPLASTY (Right)  Patient Location: room 305  Anesthesia Type:Spinal  Level of Consciousness: awake, alert , oriented and patient cooperative  Airway and Oxygen Therapy: Patient Spontanous Breathing  Post-op Pain: 3 /10, mild  Post-op Assessment: Post-op Vital signs reviewed, Patient's Cardiovascular Status Stable, Respiratory Function Stable, Patent Airway, No signs of Nausea or vomiting, Adequate PO intake and Pain level controlled  Post-op Vital Signs: Reviewed and stable  Complications: No apparent anesthesia complications

## 2012-08-07 NOTE — Clinical Social Work Note (Signed)
CSW received referral for possible SNF. PT evaluated pt and recommendation is for home health. CSW signing off but can be reconsulted if needed.  Derenda Fennel, [place] [phone]

## 2012-08-07 NOTE — Evaluation (Signed)
Physical Therapy Evaluation Patient Details Name: Terry Herman MRN: [id number] DOB: [birth year] Today's Date: 08/07/2012 Time: 4098-1191 PT Time Calculation (min): 50 min  PT Assessment / Plan / Recommendation Clinical Impression  Pt was seen for eval/tx of R THR.  He had a L THR done last Nov. and recovered well from that.  He is retired and independent at home, ambulated with no AD.  He is married and wife/family will be available to assist at d/c/  He tol. ther ex well this AM  was able to transfer OOB with min assist and ambulate 15' with walker WBAT R.  He should progress through the protocol well and be able to transition to home at d/c.    PT Assessment  Patient needs continued PT services    Follow Up Recommendations  Home health PT    Does the patient have the potential to tolerate intense rehabilitation      Barriers to Discharge None      Equipment Recommendations  None recommended by PT    Recommendations for Other Services OT consult   Frequency 7X/week    Precautions / Restrictions Precautions Precautions: Anterior Hip Precaution Booklet Issued: No Precaution Comments: Pt had L THR done last Nov. so is experienced with protocol Restrictions Weight Bearing Restrictions: No Other Position/Activity Restrictions: WBAT R   Pertinent Vitals/Pain       Mobility  Bed Mobility Bed Mobility: Supine to Sit;Sit to Supine Supine to Sit: 4: Min assist;HOB elevated Sit to Supine: Not Tested (comment) Transfers Transfers: Sit to Stand;Stand to Sit Sit to Stand: 5: Supervision;With upper extremity assist;From bed Stand to Sit: 5: Supervision;With upper extremity assist;To chair/3-in-1 Ambulation/Gait Ambulation/Gait Assistance: 5: Supervision Ambulation Distance (Feet): 15 Feet Assistive device: Rolling walker Gait Pattern: Step-through pattern;Decreased step length - left;Decreased stance time - right Stairs: No Wheelchair Mobility Wheelchair Mobility: No     Exercises Total Joint Exercises Ankle Circles/Pumps: AROM;Both;10 reps;Supine Quad Sets: AROM;Both;10 reps;Supine Gluteal Sets: AROM;Both;10 reps;Supine Short Arc Quad: AAROM;Right;10 reps;Supine Heel Slides: AAROM;Right;10 reps;Supine   PT Diagnosis: Difficulty walking;Abnormality of gait;Acute pain  PT Problem List: Decreased strength;Decreased range of motion;Decreased activity tolerance;Decreased mobility;Pain PT Treatment Interventions: Gait training;Stair training;Therapeutic activities;Therapeutic exercise;Functional mobility training;Patient/family education   PT Goals Acute Rehab PT Goals PT Goal Formulation: With patient Time For Goal Achievement: 08/14/12 Pt will go Supine/Side to Sit: with modified independence PT Goal: Supine/Side to Sit - Progress: Goal set today Pt will go Sit to Supine/Side: with modified independence PT Goal: Sit to Supine/Side - Progress: Goal set today Pt will go Sit to Stand: with modified independence PT Goal: Sit to Stand - Progress: Goal set today Pt will go Stand to Sit: with modified independence PT Goal: Stand to Sit - Progress: Goal set today Pt will Ambulate: 51 - 150 feet PT Goal: Ambulate - Progress: Goal set today Pt will Go Up / Down Stairs: 3-5 stairs;with supervision;with rail(s) PT Goal: Up/Down Stairs - Progress: Goal set today Pt will Perform Home Exercise Program: with supervision, verbal cues required/provided PT Goal: Perform Home Exercise Program - Progress: Goal set today  Visit Information  Last PT Received On: 08/07/12    Subjective Data  Subjective: Pt voices no c/o Patient Stated Goal: none stated   Prior Functioning  Home Living Lives With: Spouse Available Help at Discharge: Family;Available 24 hours/day Type of Home: House Home Access: Stairs to enter [employer] of Steps: 5 Entrance Stairs-Rails: Left;Right;Can reach both Home Layout: One level Firefighter: Standard  Home Adaptive  Equipment: Bedside commode/3-in-1;Walker - rolling;Straight cane Prior Function Level of Independence: Independent Able to Take Stairs?: Yes Driving: Yes Vocation: Retired Musician: No difficulties    Copywriter, advertising Overall Cognitive Status: Appears within functional limits for tasks assessed/performed Arousal/Alertness: Awake/alert Orientation Level: Appears intact for tasks assessed Behavior During Session: [facility] for tasks performed    Extremity/Trunk Assessment Right Lower Extremity Assessment RLE ROM/Strength/Tone: Deficits;Due to pain RLE ROM/Strength/Tone Deficits: has AA hip flex to 50 deg, strength 2+/5 due to recent surgery RLE Sensation: WFL - Light Touch Left Lower Extremity Assessment LLE ROM/Strength/Tone: Within functional levels LLE Sensation: WFL - Light Touch LLE Coordination: WFL - gross motor Trunk Assessment Trunk Assessment: Normal   Balance Balance Balance Assessed:  (WFL by observation)  End of Session PT - End of Session Equipment Utilized During Treatment: Gait belt Activity Tolerance: Patient tolerated treatment well Patient left: in chair;with call bell/phone within reach Nurse Communication: Mobility status  GP     Konrad Penta 08/07/2012, 9:46 AM

## 2012-08-08 LAB — CBC
HCT: 25 % — ABNORMAL LOW (ref 39.0–52.0)
Hemoglobin: 8.6 g/dL — ABNORMAL LOW (ref 13.0–17.0)
MCV: 93.3 fL (ref 78.0–100.0)
RBC: 2.68 MIL/uL — ABNORMAL LOW (ref 4.22–5.81)
RDW: 12.9 % (ref 11.5–15.5)
WBC: 5.9 10*3/uL (ref 4.0–10.5)

## 2012-08-08 MED ORDER — OXYCODONE-ACETAMINOPHEN 5-325 MG PO TABS
1.0000 | ORAL_TABLET | ORAL | Status: DC
  Administered 2012-08-08 – 2012-08-09 (×7): 1 via ORAL
  Filled 2012-08-08 (×7): qty 1

## 2012-08-08 NOTE — Progress Notes (Signed)
Physical Therapy Treatment Patient Details Name: Terry Herman MRN: [id number] DOB: July 17, 1945 Today's Date: 08/08/2012 Time: 4098-1191 PT Time Calculation (min): 21 min  PT Assessment / Plan / Recommendation Comments on Treatment Session  Increased cadence and distance with RW  SBA with min cueing with gait training to increase stride length for more normalized gait.  Pt stated pain increase R hip following gait training, ice applied end of session for pain relieve.  Pt left in bed with call bell within reach.    Follow Up Recommendations        Does the patient have the potential to tolerate intense rehabilitation     Barriers to Discharge        Equipment Recommendations       Recommendations for Other Services    Frequency     Plan      Precautions / Restrictions Precautions Precautions: Anterior Hip Precaution Booklet Issued: No Restrictions Weight Bearing Restrictions: No Other Position/Activity Restrictions: WBAT R   Pertinent Vitals/Pain     Mobility  Bed Mobility Bed Mobility: Supine to Sit Supine to Sit: 4: Min assist;Other (comment) (assist RLE OOB) Sit to Supine: 4: Min assist;HOB flat Transfers Transfers: Sit to Stand;Stand to Sit Sit to Stand: 4: Min guard;With armrests Stand to Sit: 4: Min guard;With upper extremity assist Ambulation/Gait Ambulation/Gait Assistance: 5: Supervision Ambulation Distance (Feet): 142 Feet Assistive device: Rolling walker Gait Pattern: Step-through pattern;Decreased step length - left;Decreased stance time - right Stairs: No    Exercises Total Joint Exercises Ankle Circles/Pumps: AROM;20 reps;Both;Supine Quad Sets: AROM;Both;Supine;10 reps Heel Slides: AAROM;Both;10 reps;Supine Hip ABduction/ADduction: AROM;Both;10 reps;Supine (isometric) Long Arc Quad: AROM;Both;10 reps Bridges: 5 reps;Both;Supine Other Exercises Other Exercises: mini squats 10x   PT Diagnosis:    PT Problem List:   PT Treatment Interventions:      PT Goals Acute Rehab PT Goals PT Goal: Supine/Side to Sit - Progress: Progressing toward goal PT Goal: Sit to Stand - Progress: Progressing toward goal PT Goal: Stand to Sit - Progress: Progressing toward goal PT Goal: Ambulate - Progress: Progressing toward goal PT Goal: Up/Down Stairs - Progress: Progressing toward goal  Visit Information  Last PT Received On: 08/08/12    Subjective Data  Subjective: I am pain free currently, pain meds given not too long ago.   Cognition  Cognition Overall Cognitive Status: Appears within functional limits for tasks assessed/performed Arousal/Alertness: Awake/alert Orientation Level: Appears intact for tasks assessed Behavior During Session: [facility] for tasks performed    Balance     End of Session PT - End of Session Equipment Utilized During Treatment: Gait belt Activity Tolerance: Patient tolerated treatment well Patient left: in bed;with call bell/phone within reach Nurse Communication: Mobility status   GP     Juel Burrow 08/08/2012, 4:59 PM

## 2012-08-08 NOTE — Progress Notes (Signed)
Physical Therapy Treatment Patient Details Name: Terry Herman MRN: [id number] DOB: 1945-11-26 Today's Date: 08/08/2012 Time: 2130-8657 PT Time Calculation (min): 35 min  PT Assessment / Plan / Recommendation Comments on Treatment Session  Pt progressing well, min assistance required only with RLE OOB.  Increased distance with walker wtih SBA and cueing for heel to toe and to equalize stride length.  Stair training instructed to pt and daughter, pt able to demonstrate appropriate mechanics and stated feels confident to complete stairs into home safely.      Follow Up Recommendations        Does the patient have the potential to tolerate intense rehabilitation     Barriers to Discharge        Equipment Recommendations       Recommendations for Other Services    Frequency     Plan      Precautions / Restrictions Precautions Precautions: Anterior Hip Precaution Booklet Issued: No Restrictions Weight Bearing Restrictions: No Other Position/Activity Restrictions: WBAT R   Pertinent Vitals/Pain     Mobility  Bed Mobility Bed Mobility: Supine to Sit Supine to Sit: 4: Min assist;Other (comment) (assist RLE out of bed) Transfers Transfers: Sit to Stand;Stand to Sit Sit to Stand: 4: Min assist;With armrests Stand to Sit: 4: Min guard;With upper extremity assist Ambulation/Gait Ambulation/Gait Assistance: 5: Supervision Ambulation Distance (Feet): 25 Feet Assistive device: Rolling walker Stairs: Yes Stairs Assistance: 5: Supervision Stair Management Technique: Two rails Number of Stairs: 5    Exercises Total Joint Exercises Ankle Circles/Pumps: AROM;20 reps;Both;Supine Quad Sets: AROM;Both;Supine;10 reps Gluteal Sets: AROM;Both;10 reps;Supine Short Arc Quad: AAROM;Both;10 reps;Supine Heel Slides: AAROM;Both;10 reps;Supine Bridges: 5 reps;Both;Supine   PT Diagnosis:    PT Problem List:   PT Treatment Interventions:     PT Goals Acute Rehab PT Goals PT Goal:  Supine/Side to Sit - Progress: Progressing toward goal PT Goal: Sit to Stand - Progress: Progressing toward goal PT Goal: Stand to Sit - Progress: Progressing toward goal PT Goal: Ambulate - Progress: Progressing toward goal PT Goal: Up/Down Stairs - Progress: Progressing toward goal  Visit Information  Last PT Received On: 08/08/12    Subjective Data  Subjective: I am feeling better today than I did yesterday, had pain meds ~2 hours ago and feeling good.   Cognition  Cognition Overall Cognitive Status: Appears within functional limits for tasks assessed/performed Arousal/Alertness: Awake/alert Orientation Level: Appears intact for tasks assessed Behavior During Session: [facility] for tasks performed    Balance     End of Session PT - End of Session Equipment Utilized During Treatment: Gait belt Activity Tolerance: Patient tolerated treatment well Patient left: in chair;with call bell/phone within reach;with family/visitor present Nurse Communication: Mobility status   GP     Juel Burrow 08/08/2012, 1:02 PM

## 2012-08-08 NOTE — Progress Notes (Signed)
Subjective: 2 Days Post-Op Procedure(s) (LRB): TOTAL HIP ARTHROPLASTY (Right) Patient reports pain as 4 on 0-10 scale.    Objective: Vital signs in last 24 hours: Temp:  [97.4 F (36.3 C)-98.3 F (36.8 C)] 97.4 F (36.3 C) (02/26 0551) Pulse Rate:  [96-98] 96 (02/26 0551) Resp:  [18-20] 18 (02/26 0551) BP: (111-126)/(40-61) 120/41 mmHg (02/26 0551) SpO2:  [91 %-93 %] 91 % (02/26 0551)  Intake/Output from previous day: 02/25 0701 - 02/26 0700 In: 2895 [P.O.:480; I.V.:2415] Out: 1700 [Urine:1700] Intake/Output this shift:     Recent Labs  08/06/12 1645 08/07/12 0347 08/08/12 0450  HGB 10.7* 9.7* 8.6*    Recent Labs  08/07/12 0347 08/08/12 0450  WBC 6.7 5.9  RBC 3.09* 2.68*  HCT 28.9* 25.0*  PLT 231 201    Recent Labs  08/06/12 1645 08/07/12 0347  NA  --  136  K  --  4.0  CL  --  100  CO2  --  31  BUN  --  13  CREATININE 0.96 0.97  GLUCOSE  --  116*  CALCIUM  --  8.7   No results found for this basename: LABPT, INR,  in the last 72 hours  Neurologically intact ABD soft Neurovascular intact Sensation intact distally Intact pulses distally Dorsiflexion/Plantar flexion intact  Assessment/Plan: 2 Days Post-Op Procedure(s) (LRB): TOTAL HIP ARTHROPLASTY (Right) Advance diet Up with therapy D/C IV fluids  Terry Herman 08/08/2012, 7:53 AM

## 2012-08-09 ENCOUNTER — Other Ambulatory Visit: Payer: Self-pay | Admitting: *Deleted

## 2012-08-09 LAB — CBC
HCT: 24.8 % — ABNORMAL LOW (ref 39.0–52.0)
Hemoglobin: 8.7 g/dL — ABNORMAL LOW (ref 13.0–17.0)
MCH: 32.3 pg (ref 26.0–34.0)
MCHC: 35.1 g/dL (ref 30.0–36.0)
MCV: 92.2 fL (ref 78.0–100.0)
RBC: 2.69 MIL/uL — ABNORMAL LOW (ref 4.22–5.81)

## 2012-08-09 MED ORDER — FERROUS SULFATE 325 (65 FE) MG PO TABS: 325.0000 mg | ORAL_TABLET | Freq: Three times a day (TID) | ORAL | Status: DC

## 2012-08-09 MED ORDER — SENNOSIDES-DOCUSATE SODIUM 8.6-50 MG PO TABS: 1.0000 | ORAL_TABLET | Freq: Every evening | ORAL | Status: DC | PRN

## 2012-08-09 MED ORDER — OXYCODONE-ACETAMINOPHEN 5-325 MG PO TABS: 1.0000 | ORAL_TABLET | ORAL | Status: DC

## 2012-08-09 MED ORDER — CYCLOBENZAPRINE HCL 10 MG PO TABS: 10.0000 mg | ORAL_TABLET | Freq: Three times a day (TID) | ORAL | Status: DC

## 2012-08-09 MED ORDER — NICOTINE 7 MG/24HR TD PT24: 1.0000 | MEDICATED_PATCH | Freq: Every day | TRANSDERMAL | Status: AC

## 2012-08-09 MED ORDER — METHOCARBAMOL 500 MG PO TABS: 500.0000 mg | ORAL_TABLET | Freq: Four times a day (QID) | ORAL | Status: DC

## 2012-08-09 MED ORDER — ASPIRIN 325 MG PO TBEC: 325.0000 mg | DELAYED_RELEASE_TABLET | Freq: Every day | ORAL | Status: AC

## 2012-08-09 NOTE — Discharge Summary (Signed)
  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  Procedure right total hip arthroplasty, implant Stryker Accolade 2 metal head plastic insert metal backed cup no screws  Admitting and discharging physician Terrance Mass M.D.  Hospital course the patient was admitted on February 24 for an uncomplicated right total hip arthroplasty. He tolerated the procedure well ambulated over 100 feet. His pain was controlled with Percocet. His incision was clean dry and intact. The neurovascular status of his lower extremities was normal; his calf was supple Homan sign negative. He was tolerating a diet.  Hemoglobin & Hematocrit     Component Value Date/Time   HGB 8.7* 08/09/2012 0455   HCT 24.8* 08/09/2012 0455   HCT 45 12/20/2011 1506    The hemoglobin was 8.7 but it is chronically low and he is on eye and he prior to admission     Medication List    TAKE these medications       amLODipine 5 MG tablet  Commonly known as:  NORVASC  Take 5 mg by mouth daily. Takes along with 10 mg tab     amLODipine 10 MG tablet  Commonly known as:  NORVASC  Take 10 mg by mouth daily. Takes along with 5 mg tab     aspirin 325 MG EC tablet  Take 1 tablet (325 mg total) by mouth daily.     ferrous sulfate 325 (65 FE) MG tablet  Take 1 tablet (325 mg total) by mouth 3 (three) times daily with meals.     methocarbamol 500 MG tablet  Commonly known as:  ROBAXIN  Take 1 tablet (500 mg total) by mouth 4 (four) times daily.     naproxen 500 MG tablet  Commonly known as:  NAPROSYN     nicotine 7 mg/24hr patch  Commonly known as:  NICODERM CQ - dosed in mg/24 hr  Place 1 patch onto the skin daily.     omeprazole 20 MG capsule  Commonly known as:  PRILOSEC  1 po bid 30 minutes before meals     oxyCODONE-acetaminophen 5-325 MG per tablet  Commonly known as:   PERCOCET/ROXICET  Take 1 tablet by mouth every 4 (four) hours.     pravastatin 40 MG tablet  Commonly known as:  PRAVACHOL  Take 40 mg by mouth daily.     senna-docusate 8.6-50 MG per tablet  Commonly known as:  Senokot-S  Take 1 tablet by mouth at bedtime as needed.       He return to the office on March 3 for staple removal  [facility] and PT  Stockings for 6 weeks aspirin for 6 weeks abduction pillow for 6 weeks Weightbearing status as tolerated

## 2012-08-09 NOTE — Progress Notes (Signed)
Patient received discharge papers with prescriptions and follow up appointments.Patient verbalized understanding of all instructions. Patient was escorted via wheelchair to vehicle. Patient discharged to home in stable condition.

## 2012-08-13 ENCOUNTER — Encounter (HOSPITAL_COMMUNITY): Payer: Self-pay

## 2012-08-13 ENCOUNTER — Emergency Department (HOSPITAL_COMMUNITY)
Admission: EM | Admit: 2012-08-13 | Discharge: 2012-08-13 | Disposition: A | Discharge status: Home / Self Care | Payer: Medicare Other | Attending: Emergency Medicine | Admitting: Emergency Medicine

## 2012-08-13 ENCOUNTER — Telehealth: Payer: Self-pay | Admitting: *Deleted

## 2012-08-13 ENCOUNTER — Emergency Department (HOSPITAL_COMMUNITY): Payer: Medicare Other

## 2012-08-13 DIAGNOSIS — Z96649 Presence of unspecified artificial hip joint: Secondary | ICD-10-CM | POA: Insufficient documentation

## 2012-08-13 DIAGNOSIS — Z79899 Other long term (current) drug therapy: Secondary | ICD-10-CM | POA: Insufficient documentation

## 2012-08-13 DIAGNOSIS — Z7982 Long term (current) use of aspirin: Secondary | ICD-10-CM | POA: Insufficient documentation

## 2012-08-13 DIAGNOSIS — D649 Anemia, unspecified: Secondary | ICD-10-CM | POA: Insufficient documentation

## 2012-08-13 DIAGNOSIS — I10 Essential (primary) hypertension: Secondary | ICD-10-CM | POA: Insufficient documentation

## 2012-08-13 DIAGNOSIS — E7800 Pure hypercholesterolemia, unspecified: Secondary | ICD-10-CM | POA: Insufficient documentation

## 2012-08-13 DIAGNOSIS — Z9889 Other specified postprocedural states: Secondary | ICD-10-CM | POA: Insufficient documentation

## 2012-08-13 DIAGNOSIS — F17200 Nicotine dependence, unspecified, uncomplicated: Secondary | ICD-10-CM | POA: Insufficient documentation

## 2012-08-13 DIAGNOSIS — M25569 Pain in unspecified knee: Secondary | ICD-10-CM | POA: Insufficient documentation

## 2012-08-13 DIAGNOSIS — Z8739 Personal history of other diseases of the musculoskeletal system and connective tissue: Secondary | ICD-10-CM | POA: Insufficient documentation

## 2012-08-13 LAB — POCT I-STAT, CHEM 8
BUN: 17 mg/dL (ref 6–23)
Calcium, Ion: 1.18 mmol/L (ref 1.13–1.30)
Creatinine, Ser: 1 mg/dL (ref 0.50–1.35)
Glucose, Bld: 98 mg/dL (ref 70–99)
Hemoglobin: 10.2 g/dL — ABNORMAL LOW (ref 13.0–17.0)
TCO2: 30 mmol/L (ref 0–100)

## 2012-08-13 MED ORDER — ENOXAPARIN SODIUM 60 MG/0.6ML ~~LOC~~ SOLN
1.0000 mg/kg | Freq: Once | SUBCUTANEOUS | Status: AC
  Administered 2012-08-13: 55 mg via SUBCUTANEOUS
  Filled 2012-08-13: qty 0.6

## 2012-08-13 NOTE — ED Provider Notes (Signed)
History     CSN: [id number]  Arrival date & time 08/13/12  1353   First MD Initiated Contact with Patient 08/13/12 1409      Chief Complaint  Patient presents with  . Leg Pain     Patient is a 67 y.o. male presenting with leg pain. The history is provided by the patient.  Leg Pain Lower extremity pain location: right calf. Injury: no   Pain details:    Quality:  Aching   Radiates to:  Does not radiate   Severity:  Moderate   Onset quality:  Gradual   Timing:  Constant   Progression:  Worsening Chronicity:  New Dislocation: no   Prior injury to area:  No Relieved by:  Nothing Exacerbated by: palpation. Ineffective treatments:  Rest Associated symptoms: tingling   Associated symptoms: no back pain, no fever and no muscle weakness   pt reports pain and swelling/"knot" in right calf No injury to right calf No cp/sob.  No new weakness.  He reports "tingling" in right foot but no weakness.  No discoloration He had recent right hip replacement and no issues.  No increased pain/swelling/drainage to right hip.  No fever is reported.    Past Medical History  Diagnosis Date  . Hypertension   . Hypercholesterolemia   . Hip pain     s/p left hip surgery Nov 4th with Dr. Romeo Apple  . Shortness of breath     exertion  . Arthritis   . Anemia     Past Surgical History  Procedure Laterality Date  . None    . 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 Total hip arthroplasty  04/16/2012    Procedure: TOTAL HIP ARTHROPLASTY;  Surgeon: Vickki Hearing, MD;  Location: [facility];  Service: Orthopedics;  Laterality: Left;  . Colonoscopy with propofol  06/26/2012    Procedure: COLONOSCOPY WITH PROPOFOL;  Surgeon: West Bali, MD;  Location: [facility];  Service: Endoscopy;  Laterality: N/A;  At cecum 0916, withdrawal time: 10 minutes, procedure end @ 0926  . Esophagogastroduodenoscopy (egd) with propofol  06/26/2012    Procedure: ESOPHAGOGASTRODUODENOSCOPY (EGD) WITH PROPOFOL;  Surgeon:  West Bali, MD;  Location: [facility];  Service: Endoscopy;  Laterality: N/A;  procedure start @ 0932  . Esophageal biopsy  06/26/2012    Procedure: BIOPSY;  Surgeon: West Bali, MD;  Location: [facility];  Service: Endoscopy;  Laterality: N/A;  gastric biopsy  . Joint replacement Left 2013    LEFT THA   . Total hip arthroplasty Right 08/06/2012    Procedure: TOTAL HIP ARTHROPLASTY;  Surgeon: Vickki Hearing, MD;  Location: [facility];  Service: Orthopedics;  Laterality: Right;  . Hip surgery      Family History  Problem Relation Age of Onset  . Arthritis    . Cancer    . Diabetes    . Colon cancer Neg Hx     History  Substance Use Topics  . Smoking status: Current Every Day Smoker -- 1.00 packs/day for 50 years    Types: Cigarettes  . Smokeless tobacco: Not on file  . Alcohol Use: No     Comment: No ETOH in past 5 years, was previously an alcoholic.       Review of Systems  Constitutional: Negative for fever.  HENT: Negative for nosebleeds.   Respiratory: Negative for shortness of breath.   Cardiovascular: Negative for chest pain.  Gastrointestinal: Negative for blood in stool.  Musculoskeletal:  Negative for back pain.  Neurological: Negative for weakness.  All other systems reviewed and are negative.    Allergies  Review of patient's allergies indicates no known allergies.  Home Medications   Current Outpatient Rx  Name  Route  Sig  Dispense  Refill  . amLODipine (NORVASC) 10 MG tablet   Oral   Take 10 mg by mouth daily. Takes along with 5 mg tab         . amLODipine (NORVASC) 5 MG tablet   Oral   Take 5 mg by mouth daily. Takes along with 10 mg tab         . aspirin EC 325 MG EC tablet   Oral   Take 1 tablet (325 mg total) by mouth daily.         . cyclobenzaprine (FLEXERIL) 10 MG tablet   Oral   Take 1 tablet (10 mg total) by mouth every 8 (eight) hours.   60 tablet   1   . ferrous sulfate 325 (65 FE) MG tablet   Oral   Take 325 mg by mouth 2  (two) times daily.         . methocarbamol (ROBAXIN) 500 MG tablet   Oral   Take 1 tablet (500 mg total) by mouth 4 (four) times daily.   56 tablet   2   . omeprazole (PRILOSEC) 20 MG capsule      1 po bid 30 minutes before meals   62 capsule   11   . oxyCODONE-acetaminophen (PERCOCET/ROXICET) 5-325 MG per tablet   Oral   Take 1 tablet by mouth every 4 (four) hours.   90 tablet   0   . pravastatin (PRAVACHOL) 40 MG tablet   Oral   Take 40 mg by mouth daily.          Marland Kitchen senna-docusate (SENOKOT-S) 8.6-50 MG per tablet   Oral   Take 1 tablet by mouth 2 (two) times daily.         . nicotine (NICODERM CQ - DOSED IN MG/24 HR) 7 mg/24hr patch   Transdermal   Place 1 patch onto the skin daily.   28 patch   5     BP 151/50  Pulse 111  Temp(Src) 98 F (36.7 C) (Oral)  Resp 18  Ht 5\' 11"  (1.803 m)  Wt 122 lb (55.339 kg)  BMI 17.02 kg/m2  SpO2 98%  Physical Exam CONSTITUTIONAL: Well developed/well nourished HEAD: Normocephalic/atraumatic EYES: EOMI/PERRL ENMT: Mucous membranes moist NECK: supple no meningeal signs CV: S1/S2 noted, no murmurs/rubs/gallops noted LUNGS: Lungs are clear to auscultation bilaterally, no apparent distress ABDOMEN: soft, nontender, no rebound or guarding NEURO: Pt is awake/alert, moves all extremitiesx4. He can range right hip and right knee/ankle EXTREMITIES: incision appears clean/dry/intact.  No erythema to incision on right thigh.   He has tenderness to right proximal calf but no erythema or abscess is noted. No distal edema is noted to either LE   SKIN: warm, color normal PSYCH: no abnormalities of mood noted  ED Course  Procedures  3:10 PM Pt with recent right total hip arthoplasty (08/06/12) now with pain to right calf.  Will obtain DVT study.  Pulses noted bedside doppler.  He has no signs of acute arterial occlusion.  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   i discussed case with dr Vennie Homans PCP (he was already in the ED).  Plan is to load with lovenox in the ED and he will see in his office tomorrow to determine if long term anticoagulation is necessary.  This may be a chronic process. Pt is otherwise well appearing.  He denies any recent blood loss/GI bleed symptoms.  Recent platelet count normal and his HGB today is improved.  He has no cp/sob.  I feel he is safe for d/c home.    MDM  Nursing notes including past medical history and social history reviewed and considered in documentation Previous records reviewed and considered         Joya Gaskins, MD 08/13/12 [id number]

## 2012-08-13 NOTE — ED Notes (Signed)
Used doppler to pick up pedal pulse bilaterally. Pedal pulse rate was 92 bmp.

## 2012-08-13 NOTE — Telephone Encounter (Signed)
Patient's daughter had originally called on Friday and left a meassage for me to call her , however I had left for the day. I called patient back this morning and he stated he was in pain and there was a knot that came up on right side of calf. Now Therapist, Kendell Bane, called and was concerned, stated patient had THA 08/06/12 and is now in a lot of  pain and there was a knot that is pretty prominent on the right side of the lateral part of the calf. Please advise. Patient # [id number]

## 2012-08-13 NOTE — ED Notes (Signed)
Pt arrives to er with his daughter. Pt complains of a "knot" located on the posterior lateral area of his right calf. Pt had hip surgery Monday and his physical therapist referred him here for further evaluation.

## 2012-08-13 NOTE — ED Notes (Signed)
Pt c/o right leg pain since last night. Pt has right hip sx 1 week ago and is at home for rehab. Pt states he began having a "tingling pain" in his right leg and a "knot" on his calf.

## 2012-08-14 ENCOUNTER — Ambulatory Visit: Payer: Medicare Other | Admitting: Orthopedic Surgery

## 2012-08-14 ENCOUNTER — Telehealth: Payer: Self-pay | Admitting: Orthopedic Surgery

## 2012-08-14 NOTE — Telephone Encounter (Signed)
Patient was told to come in for appointment 08/14/12

## 2012-08-14 NOTE — Telephone Encounter (Signed)
Any post op problems with wounds or calf pain   Need immediate visit if not sure ask me or renee But usually we see these right away

## 2012-08-14 NOTE — Telephone Encounter (Addendum)
I called patient(approximately 3:00p.m) to follow up about the scheduled appointment for today, 08/14/12, which we had discussed with him yesterday (Per note entered by nurse).  Patient states he went to the Emergency Room at [facility] yesterday, and said was scheduled for appointment with his primary care, Dr. Janna Arch tomorrow, 08/15/12 and therefore did not want to come to this appointment.  He said he is aware of his post op#1 visit for Monday, 08/20/12.  His phone #'s are:   [phone] [facility]) [phone] [facility])

## 2012-08-17 NOTE — Progress Notes (Signed)
TCS JAN 2014 [facility]  EGD JAN 2014 NSAID GASTRITIS  REVIEWED.

## 2012-08-20 ENCOUNTER — Ambulatory Visit (INDEPENDENT_AMBULATORY_CARE_PROVIDER_SITE_OTHER): Payer: Medicare Other | Admitting: Orthopedic Surgery

## 2012-08-20 VITALS — BP 120/48 | Ht 71.0 in | Wt 122.0 lb

## 2012-08-20 DIAGNOSIS — Z96649 Presence of unspecified artificial hip joint: Secondary | ICD-10-CM

## 2012-08-20 MED ORDER — OXYCODONE-ACETAMINOPHEN 5-325 MG PO TABS: 1.0000 | ORAL_TABLET | ORAL | Status: DC

## 2012-08-20 NOTE — Patient Instructions (Addendum)
Keep stockings on x 2 more weeks  And take aspirin 2 more weeks  Stop iron  Stop oxycodone 10

## 2012-08-20 NOTE — Progress Notes (Signed)
Patient ID: Terry Herman, male   DOB: [birth year], 67 y.o.   MRN: [id number] Chief Complaint  Patient presents with  . Follow-up    Post op recheck of right THA. DOS 08-06-12.   2 weeks after right total hip. Patient left total hip. He says this one hurts more. He oxycodone 10 mg every 6 hours. He went to the emergency room had a DVT study was normal. He has no signs of DVT other than a knot that is in the subcutaneous tissue of his right calf. He had an ultrasound it showed question superficial femoral vein thrombosis  He was given a shot of Lovenox and his primary care physician decided not to treat. He  is still on aspirin and TED hose  His wound looks good he has some tenderness over the greater trochanter. Wound looks benign. Calf is not swollen note leg swelling no calf tenderness  Status post total hip right doing well change from oxycodone 10 back to Percocet 5 the upset stomach I believe is from the iron. He was placed on iron secondary to a low hemoglobin and at that time it was decided not to give him Lovenox because of the low hemoglobin and treat him with iron.  Status post right total hip arthroplasty  Followup 2 weeks so I can check his leg again continue Percocet 10/14/2023 stop iron

## 2012-09-03 ENCOUNTER — Ambulatory Visit (INDEPENDENT_AMBULATORY_CARE_PROVIDER_SITE_OTHER): Payer: Medicare Other | Admitting: Orthopedic Surgery

## 2012-09-03 VITALS — BP 150/56 | Ht 71.0 in | Wt 122.0 lb

## 2012-09-03 DIAGNOSIS — Z96649 Presence of unspecified artificial hip joint: Secondary | ICD-10-CM

## 2012-09-03 MED ORDER — OXYCODONE-ACETAMINOPHEN 5-325 MG PO TABS: 1.0000 | ORAL_TABLET | ORAL | Status: DC

## 2012-09-03 NOTE — Patient Instructions (Signed)
Activity advance as tolerated   Putting and chipping ok   In 4 weeks driving range

## 2012-09-03 NOTE — Progress Notes (Signed)
Patient ID: Terry Herman, male   DOB: [birth year], 67 y.o.   MRN: [id number] Chief Complaint  Patient presents with  . Follow-up    Post op 2 Right THA DOS 08/06/12    BP 150/56  Ht 5\' 11"  (1.803 m)  Wt 122 lb (55.339 kg)  BMI 17.02 kg/m2  He has improved  And his pain is better, he is no longer using a cane   Refill percoet   F/u 1 month

## 2012-10-04 ENCOUNTER — Encounter: Payer: Self-pay | Admitting: Orthopedic Surgery

## 2012-10-04 ENCOUNTER — Ambulatory Visit (INDEPENDENT_AMBULATORY_CARE_PROVIDER_SITE_OTHER): Payer: Medicare Other | Admitting: Orthopedic Surgery

## 2012-10-04 VITALS — BP 146/58 | Ht 71.0 in | Wt 122.0 lb

## 2012-10-04 DIAGNOSIS — Z96649 Presence of unspecified artificial hip joint: Secondary | ICD-10-CM

## 2012-10-04 DIAGNOSIS — M8700 Idiopathic aseptic necrosis of unspecified bone: Secondary | ICD-10-CM

## 2012-10-04 MED ORDER — OXYCODONE-ACETAMINOPHEN 5-325 MG PO TABS: 1.0000 | ORAL_TABLET | Freq: Four times a day (QID) | ORAL | Status: DC | PRN

## 2012-10-04 NOTE — Patient Instructions (Addendum)
activities as tolerated 

## 2012-10-04 NOTE — Progress Notes (Signed)
Patient ID: Terry Herman, male   DOB: [birth year], 67 y.o.   MRN: [id number] Chief Complaint  Patient presents with  . Follow-up    1 month recheck right THA DOS 08/06/12   BP 146/58  Ht 5\' 11"  (1.803 m)  Wt 122 lb (55.339 kg)  BMI 17.02 kg/m2  The patient has had bilateral total hips the most recent in February he is doing well no complaints of pain leg lengths are equal no assistive devices normal flexion followup in 4 months with a 6 month followup

## 2012-10-25 ENCOUNTER — Encounter: Payer: Self-pay | Admitting: Gastroenterology

## 2012-10-30 ENCOUNTER — Telehealth: Payer: Self-pay | Admitting: Orthopedic Surgery

## 2012-10-30 ENCOUNTER — Other Ambulatory Visit: Payer: Self-pay | Admitting: *Deleted

## 2012-10-30 DIAGNOSIS — Z96649 Presence of unspecified artificial hip joint: Secondary | ICD-10-CM

## 2012-10-30 MED ORDER — METHOCARBAMOL 500 MG PO TABS: 500.0000 mg | ORAL_TABLET | Freq: Four times a day (QID) | ORAL | Status: DC

## 2012-10-30 NOTE — Telephone Encounter (Signed)
Routed to Dr. Harrison 

## 2012-10-30 NOTE — Telephone Encounter (Signed)
Patient called, requests refill of the following:    1. oxyCODONE-acetaminophen (PERCOCET/ROXICET) 5-325 MG per tablet 60 tablet 0 10/04/2012 Take 1 tablet by mouth every 6 (six) hours as needed for pain. - Oral * patient states needs new written prescription   2. Methocarb/ROBAXIN Take 1 tablet 4x day *            * this medication has been requested through patient's pharmacy, [facility], [place].  Patient's next scheduled appointment is in August, 2014.   Please advise, patient [phone] (Home)

## 2012-10-31 ENCOUNTER — Other Ambulatory Visit: Payer: Self-pay | Admitting: *Deleted

## 2012-10-31 DIAGNOSIS — Z96649 Presence of unspecified artificial hip joint: Secondary | ICD-10-CM

## 2012-10-31 MED ORDER — CYCLOBENZAPRINE HCL 10 MG PO TABS: 10.0000 mg | ORAL_TABLET | Freq: Three times a day (TID) | ORAL | Status: DC

## 2012-10-31 MED ORDER — OXYCODONE-ACETAMINOPHEN 5-325 MG PO TABS: 1.0000 | ORAL_TABLET | Freq: Four times a day (QID) | ORAL | Status: DC | PRN

## 2012-10-31 NOTE — Telephone Encounter (Signed)
Printed prescription for oxycodone for patient to pick up, and sent flexeril prescription into pharmacy. Robaxin is not covered by patient's insurance, so it was changed to flexeril per Dr. Romeo Apple.

## 2012-10-31 NOTE — Telephone Encounter (Signed)
Terry Herman  CAN YOU TAKE CARE OF THIS

## 2012-11-07 ENCOUNTER — Encounter: Payer: Self-pay | Admitting: Gastroenterology

## 2012-11-08 ENCOUNTER — Ambulatory Visit: Payer: Medicare Other | Admitting: Gastroenterology

## 2012-11-20 ENCOUNTER — Ambulatory Visit: Payer: Medicare Other | Admitting: Gastroenterology

## 2012-12-05 ENCOUNTER — Encounter: Payer: Self-pay | Admitting: Gastroenterology

## 2012-12-05 ENCOUNTER — Ambulatory Visit (INDEPENDENT_AMBULATORY_CARE_PROVIDER_SITE_OTHER): Payer: Medicare Other | Admitting: Gastroenterology

## 2012-12-05 VITALS — BP 130/69 | HR 109 | Temp 98.3°F | Ht 71.0 in | Wt 123.0 lb

## 2012-12-05 DIAGNOSIS — K7689 Other specified diseases of liver: Secondary | ICD-10-CM

## 2012-12-05 DIAGNOSIS — K760 Fatty (change of) liver, not elsewhere classified: Secondary | ICD-10-CM

## 2012-12-05 DIAGNOSIS — R634 Abnormal weight loss: Secondary | ICD-10-CM

## 2012-12-05 DIAGNOSIS — K5900 Constipation, unspecified: Secondary | ICD-10-CM

## 2012-12-05 MED ORDER — LINACLOTIDE 290 MCG PO CAPS: 1.0000 | ORAL_CAPSULE | Freq: Every day | ORAL | Status: DC

## 2012-12-05 NOTE — Progress Notes (Signed)
Referring Provider: Isabella Stalling, MD Primary Care Physician:  Isabella Stalling, MD Primary GI: Dr. Darrick Penna   Chief Complaint  Patient presents with  . Follow-up    HPI:   Terry Herman is a 67 y.o. male returning today in follow-up after EGD/TCS. He has a history of unexplained weight loss, which prompted the procedures. His weight is stable and actually close to his baseline now.  Denies abdominal pain. +constipation. Takes stool softener every now and then. BM every few days. Hard. No reflux. No dysphagia. On Naprosyn BID. Appetite good. No N/V. Prilosec BID.   Past Medical History  Diagnosis Date  . Hypertension   . Hypercholesterolemia   . Hip pain     s/p left hip surgery Nov 4th with Dr. Romeo Apple  . Shortness of breath     exertion  . Arthritis   . Anemia     Past Surgical History  Procedure Laterality Date  . None    . 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 Total hip arthroplasty  04/16/2012    Procedure: TOTAL HIP ARTHROPLASTY;  Surgeon: Vickki Hearing, MD;  Location: [facility];  Service: Orthopedics;  Laterality: Left;  . Colonoscopy with propofol  06/26/2012    NWG:NFAOZH mucosa in the terminal ileum otherwise normal colon/small internal hemorrhoids  . Esophagogastroduodenoscopy (egd) with propofol  06/26/2012    YQM:VHQION erosion/erosive gastritis/duodenitis negative path for H.pylori  . Esophageal biopsy  06/26/2012    Procedure: BIOPSY;  Surgeon: West Bali, MD;  Location: [facility];  Service: Endoscopy;  Laterality: N/A;  gastric biopsy  . Joint replacement Left 2013    LEFT THA   . Total hip arthroplasty Right 08/06/2012    Procedure: TOTAL HIP ARTHROPLASTY;  Surgeon: Vickki Hearing, MD;  Location: [facility];  Service: Orthopedics;  Laterality: Right;  . Hip surgery      Current Outpatient Prescriptions  Medication Sig Dispense Refill  . amLODipine (NORVASC) 10 MG tablet Take 10 mg by mouth daily. Takes along with 5 mg tab      . amLODipine  (NORVASC) 5 MG tablet Take 5 mg by mouth daily. Takes along with 10 mg tab      . aspirin EC 325 MG EC tablet Take 1 tablet (325 mg total) by mouth daily.      . cyclobenzaprine (FLEXERIL) 10 MG tablet Take 1 tablet (10 mg total) by mouth every 8 (eight) hours.  60 tablet  1  . lisinopril (PRINIVIL,ZESTRIL) 10 MG tablet Take 10 mg by mouth daily.       . methocarbamol (ROBAXIN) 500 MG tablet Take 1 tablet (500 mg total) by mouth 4 (four) times daily.  56 tablet  2  . naproxen (NAPROSYN) 500 MG tablet Take 500 mg by mouth 2 (two) times daily with a meal.       . nicotine (NICODERM CQ - DOSED IN MG/24 HR) 7 mg/24hr patch Place 1 patch onto the skin daily.  28 patch  5  . omeprazole (PRILOSEC) 20 MG capsule 1 po bid 30 minutes before meals  62 capsule  11  . oxyCODONE-acetaminophen (PERCOCET) 10-325 MG per tablet Take 1 tablet by mouth every 6 (six) hours as needed.       . pravastatin (PRAVACHOL) 40 MG tablet Take 40 mg by mouth daily.       Marland Kitchen senna-docusate (SENOKOT-S) 8.6-50 MG per tablet Take 1 tablet by mouth 2 (two) times daily.      Marland Kitchen  Linaclotide (LINZESS) 290 MCG CAPS Take 1 capsule by mouth daily.  30 capsule  3   No current facility-administered medications for this visit.    Allergies as of 12/05/2012  . (No Known Allergies)    Family History  Problem Relation Age of Onset  . Arthritis    . Cancer    . Diabetes    . Colon cancer Neg Hx     History   Social History  . Marital Status: Married    Spouse Name: N/A    Number of Children: N/A  . Years of Education: N/A   Social History Main Topics  . Smoking status: Current Every Day Smoker -- 1.00 packs/day for 50 years    Types: Cigarettes  . Smokeless tobacco: None  . Alcohol Use: No     Comment: No ETOH in past 5 years, was previously an alcoholic.   . Drug Use: No  . Sexually Active: Yes    Birth Control/ Protection: None   Other Topics Concern  . None   Social History Narrative  . None    Review of  Systems: Negative unless mentioned in HPI.   Physical Exam: BP 130/69  Pulse 109  Temp(Src) 98.3 F (36.8 C) (Oral)  Ht 5\' 11"  (1.803 m)  Wt 123 lb (55.792 kg)  BMI 17.16 kg/m2 General:   Alert and oriented. No distress noted. Pleasant and cooperative.  Head:  Normocephalic and atraumatic. Eyes:  Conjuctiva clear without scleral icterus. Mouth:  Oral mucosa pink and moist.  Neck:  Supple, without mass or thyromegaly. Heart:  S1, S2 present without murmurs, rubs, or gallops. Regular rate and rhythm. Abdomen:  +BS, soft, non-tender and non-distended. No rebound or guarding. No HSM or masses noted. Msk:  Symmetrical without gross deformities. Normal posture. Extremities:  Without edema. Neurologic:  Alert and  oriented x4;  grossly normal neurologically. Skin:  Intact without significant lesions or rashes. Psych:  Alert and cooperative. Normal mood and affect.

## 2012-12-05 NOTE — Patient Instructions (Addendum)
Continue taking Prilosec each morning before breakfast and before dinner.  Take Linzess 1 capsule each morning on an empty stomach, 30 minutes before breakfast. This is for constipation. I have given you a voucher to get one month free. Further refills are at the pharmacy.   We will see you back in 2-3 months

## 2012-12-07 ENCOUNTER — Encounter: Payer: Self-pay | Admitting: Gastroenterology

## 2012-12-07 DIAGNOSIS — K5900 Constipation, unspecified: Secondary | ICD-10-CM | POA: Insufficient documentation

## 2012-12-07 DIAGNOSIS — K760 Fatty (change of) liver, not elsewhere classified: Secondary | ICD-10-CM | POA: Insufficient documentation

## 2012-12-07 NOTE — Assessment & Plan Note (Signed)
Noted on CT in 2007. Check LFTs at return in 2-3 months.

## 2012-12-07 NOTE — Assessment & Plan Note (Signed)
Stable, close to baseline. Return in 2-3 months.

## 2012-12-07 NOTE — Assessment & Plan Note (Signed)
Start Linzess 290 mcg daily. Return in 2-3 months.

## 2012-12-10 NOTE — Progress Notes (Signed)
Cc PCP 

## 2012-12-11 ENCOUNTER — Telehealth: Payer: Self-pay | Admitting: Gastroenterology

## 2012-12-11 NOTE — Telephone Encounter (Signed)
To AS.   

## 2012-12-11 NOTE — Telephone Encounter (Signed)
Forwarding to AS.

## 2012-12-11 NOTE — Telephone Encounter (Signed)
Pt called this afternoon to let us know that he can not afford to get his medicine. Linzess. Can we get him some samples or call in something else a little cheaper that his insurance might cover? Please advise [phone]

## 2012-12-13 MED ORDER — LUBIPROSTONE 24 MCG PO CAPS: 24.0000 ug | ORAL_CAPSULE | Freq: Two times a day (BID) | ORAL | Status: DC

## 2012-12-13 NOTE — Addendum Note (Signed)
Addended by: Nira Retort on: 12/13/2012 02:56 PM   Modules accepted: Orders

## 2012-12-13 NOTE — Telephone Encounter (Signed)
Called and informed pt. He said the Linzess was going to cost him $40.00 and he could not afford it. I told him to let us know if there was any problem with the Amitiza.

## 2012-12-13 NOTE — Telephone Encounter (Signed)
Trial of Amitiza 24 mcg po BID. I sent to pharmacy.  His insurance should cover Linzess? Is it a PA issue?

## 2012-12-31 ENCOUNTER — Other Ambulatory Visit: Payer: Self-pay | Admitting: *Deleted

## 2012-12-31 DIAGNOSIS — Z96649 Presence of unspecified artificial hip joint: Secondary | ICD-10-CM

## 2012-12-31 MED ORDER — CYCLOBENZAPRINE HCL 10 MG PO TABS: 10.0000 mg | ORAL_TABLET | Freq: Three times a day (TID) | ORAL | Status: DC

## 2013-02-04 ENCOUNTER — Ambulatory Visit: Payer: Medicare Other | Admitting: Gastroenterology

## 2013-02-05 ENCOUNTER — Ambulatory Visit (INDEPENDENT_AMBULATORY_CARE_PROVIDER_SITE_OTHER): Payer: Medicare Other | Admitting: Orthopedic Surgery

## 2013-02-05 VITALS — BP 166/78 | Ht 71.0 in | Wt 122.0 lb

## 2013-02-05 DIAGNOSIS — Z96649 Presence of unspecified artificial hip joint: Secondary | ICD-10-CM

## 2013-02-05 MED ORDER — CYCLOBENZAPRINE HCL 10 MG PO TABS: 10.0000 mg | ORAL_TABLET | Freq: Three times a day (TID) | ORAL | Status: DC

## 2013-02-05 NOTE — Patient Instructions (Signed)
activities as tolerated 

## 2013-02-05 NOTE — Progress Notes (Signed)
Patient ID: Terry Herman, male   DOB: 30-Mar-1946, 67 y.o.   MRN: [id number]  Chief Complaint  Patient presents with  . Follow-up    Right THA 6 mo follow up .DOS 08/06/12    History [name] is continuing to do well with no complaints and is right or left hip status post bilateral total hips the right in February  He does have some groin discomfort on occasion.  Review of systems otherwise negative  BP 166/78  Ht 5\' 11"  (1.803 m)  Wt 122 lb (55.339 kg)  BMI 17.02 kg/m2 He has no tenderness or pain in either hip his leg lengths are equal with range of motion is normal his hip flexion is 130 the hips are stable motor exam is normal the skin is intact is good distal pulses  Status post bilateral total hip  Recommend hip x-ray in 6 months pelvic x-ray

## 2013-02-28 ENCOUNTER — Ambulatory Visit: Payer: Medicare Other | Admitting: Gastroenterology

## 2013-03-28 IMAGING — CR DG PORTABLE PELVIS
1 series · 1 of 1 positions shown · non-contrast
Comparison: 12/30/2011 MRI

CLINICAL DATA: Postoperative radiograph for left hip arthroplasty.

PORTABLE PELVIS

[view not recorded]
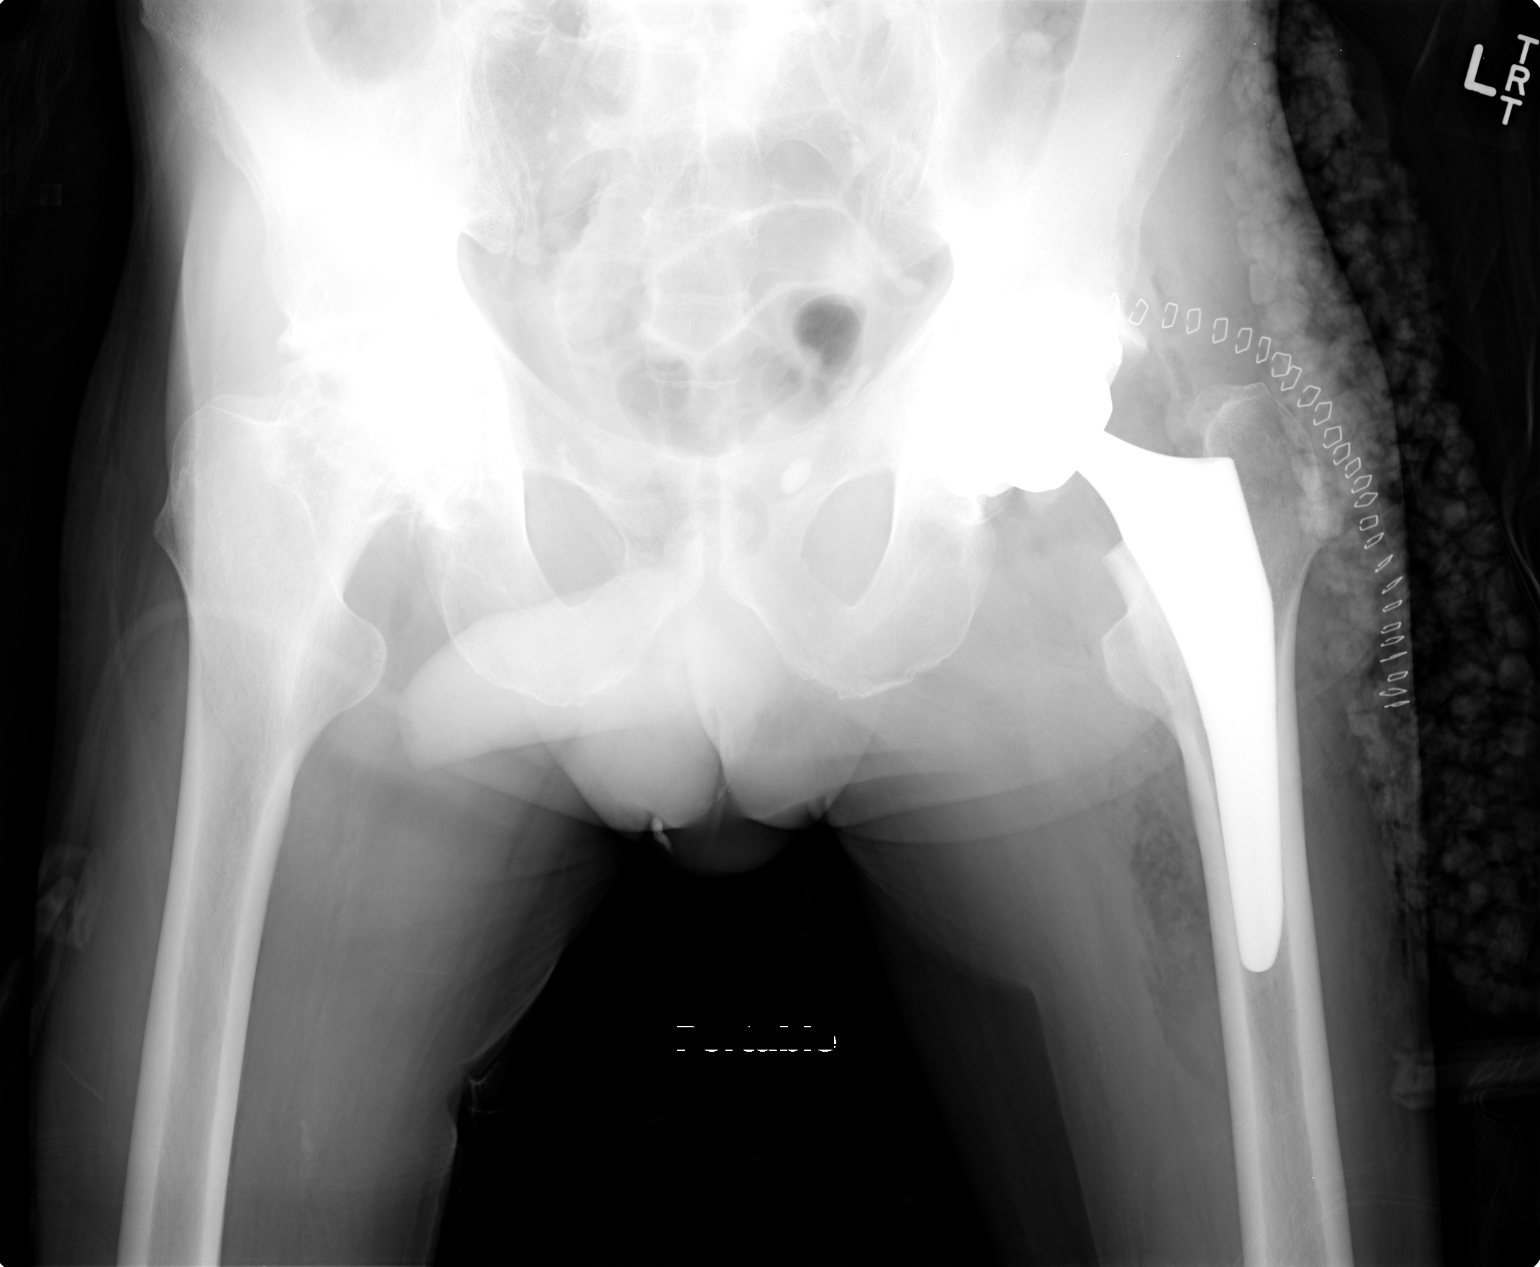

[1 of 1 positions shown; findings below may reference images not displayed]

FINDINGS: Interval left hip arthroplasty.  Expected postoperative
changes with skin staples and subcutaneous gas.  No periprosthetic
lucency or evidence for dislocation.  Severe right hip DJD and
avascular necrosis again noted, described in detail on recent MRI.
Oval calcific density projecting over the left parasymphyseal
region is presumably a phlebolith when correlated with prior
radiograph.
IMPRESSION: Postoperative changes of left hip arthroplasty without
complication.

Right femoral AVN.

## 2013-06-14 NOTE — Progress Notes (Signed)
REVIEWED.  

## 2013-07-18 IMAGING — CR DG PORTABLE PELVIS
1 series · 1 of 1 positions shown · non-contrast
Comparison: 04/16/2012

CLINICAL DATA: Postop right hip replacement.

PORTABLE PELVIS

[view not recorded]
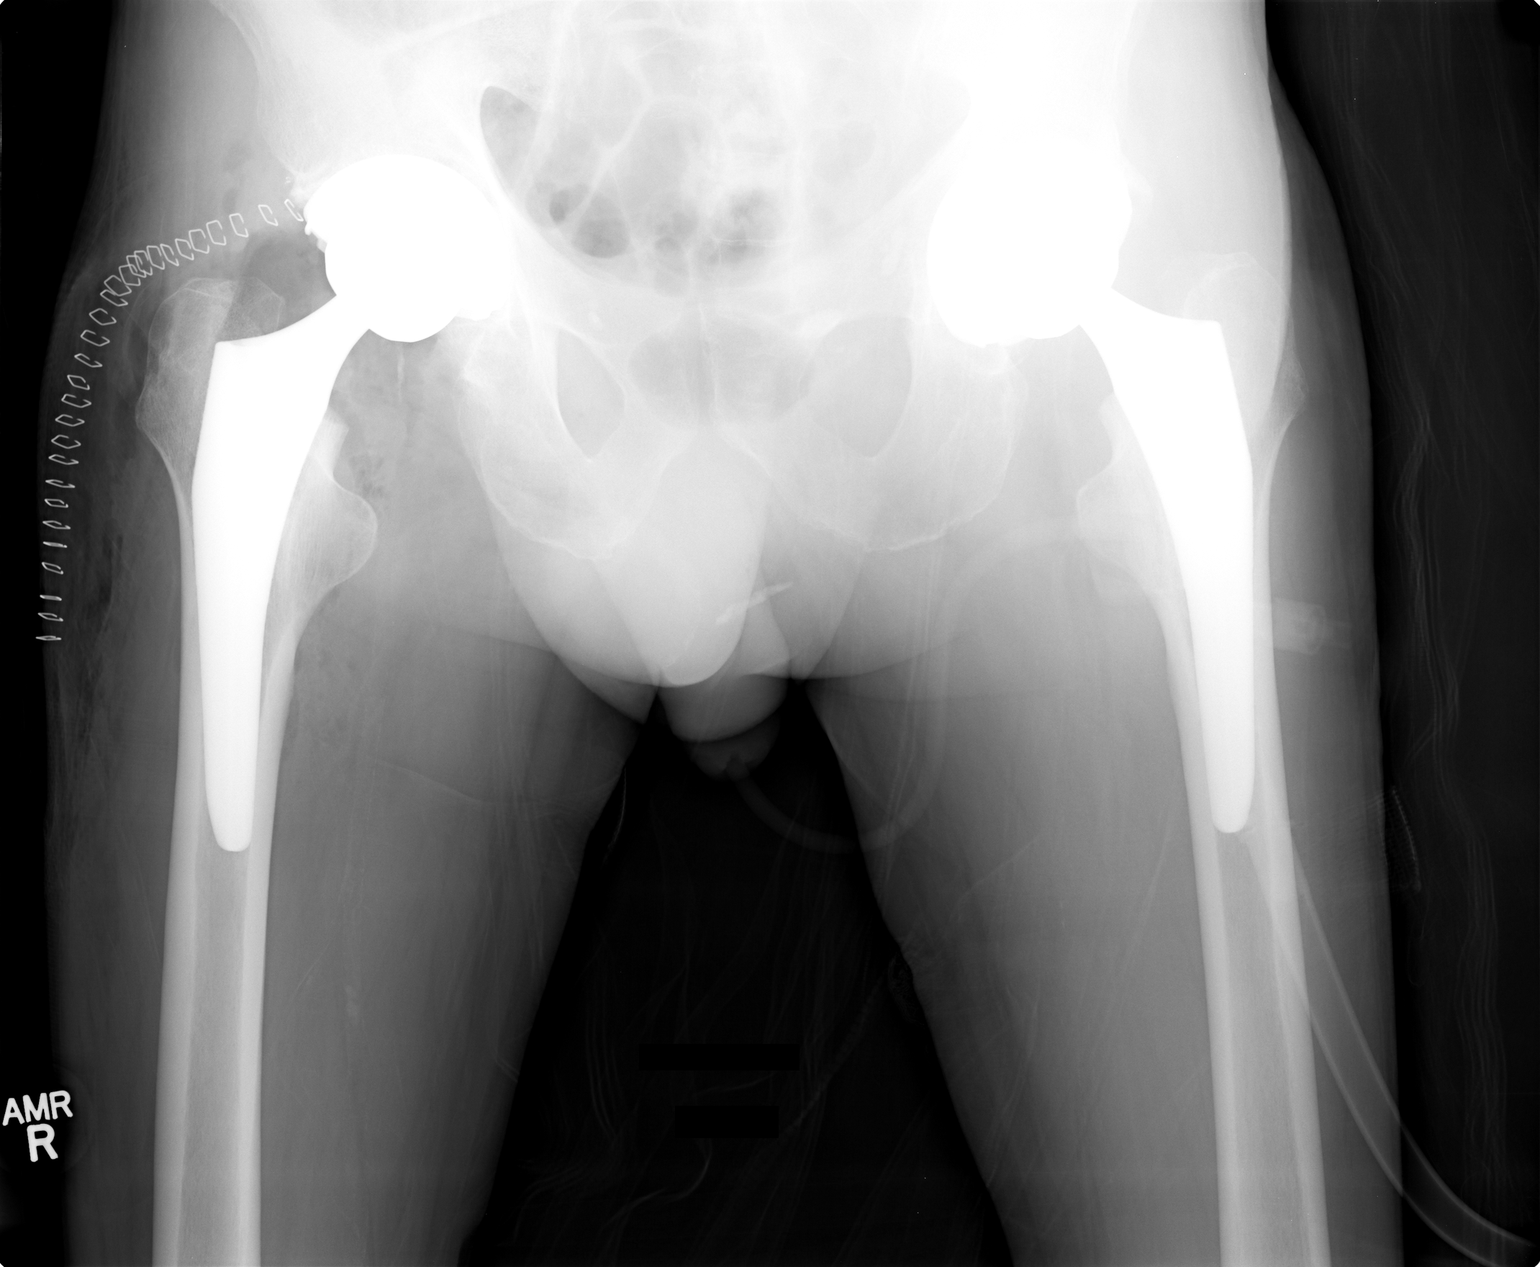

[1 of 1 positions shown; findings below may reference images not displayed]

FINDINGS: Remote changes of left hip replacement.  Interval right
hip replacement.  Soft tissue gas and skin staples noted on the
right.  Normal AP alignment.  No hardware or bony complicating
feature.
IMPRESSION: Right hip replacement.  No complicating feature.

## 2013-08-08 ENCOUNTER — Ambulatory Visit: Payer: Medicare Other | Admitting: Orthopedic Surgery

## 2013-09-12 ENCOUNTER — Ambulatory Visit: Payer: Medicare Other | Admitting: Orthopedic Surgery

## 2013-10-15 ENCOUNTER — Ambulatory Visit: Payer: Medicare Other | Admitting: Orthopedic Surgery

## 2013-11-21 ENCOUNTER — Ambulatory Visit: Payer: Medicare Other | Admitting: Orthopedic Surgery

## 2014-08-18 ENCOUNTER — Emergency Department (HOSPITAL_COMMUNITY): Payer: Medicare Other

## 2014-08-18 ENCOUNTER — Encounter (HOSPITAL_COMMUNITY): Payer: Self-pay

## 2014-08-18 ENCOUNTER — Inpatient Hospital Stay (HOSPITAL_COMMUNITY)
Admission: EM | Admit: 2014-08-18 | Discharge: 2014-08-24 | DRG: 189 | Disposition: A | Discharge status: Home Health | Payer: Medicare Other | Attending: Family Medicine | Admitting: Family Medicine

## 2014-08-18 DIAGNOSIS — E785 Hyperlipidemia, unspecified: Secondary | ICD-10-CM | POA: Diagnosis present

## 2014-08-18 DIAGNOSIS — Z7982 Long term (current) use of aspirin: Secondary | ICD-10-CM

## 2014-08-18 DIAGNOSIS — M1990 Unspecified osteoarthritis, unspecified site: Secondary | ICD-10-CM | POA: Diagnosis present

## 2014-08-18 DIAGNOSIS — Z720 Tobacco use: Secondary | ICD-10-CM

## 2014-08-18 DIAGNOSIS — Z79891 Long term (current) use of opiate analgesic: Secondary | ICD-10-CM

## 2014-08-18 DIAGNOSIS — F17210 Nicotine dependence, cigarettes, uncomplicated: Secondary | ICD-10-CM | POA: Diagnosis present

## 2014-08-18 DIAGNOSIS — M8700 Idiopathic aseptic necrosis of unspecified bone: Secondary | ICD-10-CM

## 2014-08-18 DIAGNOSIS — R739 Hyperglycemia, unspecified: Secondary | ICD-10-CM | POA: Diagnosis present

## 2014-08-18 DIAGNOSIS — J9600 Acute respiratory failure, unspecified whether with hypoxia or hypercapnia: Secondary | ICD-10-CM | POA: Diagnosis not present

## 2014-08-18 DIAGNOSIS — I10 Essential (primary) hypertension: Secondary | ICD-10-CM | POA: Diagnosis present

## 2014-08-18 DIAGNOSIS — M25651 Stiffness of right hip, not elsewhere classified: Secondary | ICD-10-CM

## 2014-08-18 DIAGNOSIS — E860 Dehydration: Secondary | ICD-10-CM | POA: Diagnosis present

## 2014-08-18 DIAGNOSIS — R338 Other retention of urine: Secondary | ICD-10-CM | POA: Diagnosis present

## 2014-08-18 DIAGNOSIS — Z9981 Dependence on supplemental oxygen: Secondary | ICD-10-CM | POA: Diagnosis not present

## 2014-08-18 DIAGNOSIS — R0602 Shortness of breath: Secondary | ICD-10-CM | POA: Diagnosis not present

## 2014-08-18 DIAGNOSIS — J441 Chronic obstructive pulmonary disease with (acute) exacerbation: Secondary | ICD-10-CM | POA: Diagnosis present

## 2014-08-18 DIAGNOSIS — R0603 Acute respiratory distress: Secondary | ICD-10-CM

## 2014-08-18 DIAGNOSIS — Z96649 Presence of unspecified artificial hip joint: Secondary | ICD-10-CM

## 2014-08-18 DIAGNOSIS — F419 Anxiety disorder, unspecified: Secondary | ICD-10-CM | POA: Diagnosis present

## 2014-08-18 DIAGNOSIS — R000 Tachycardia, unspecified: Secondary | ICD-10-CM

## 2014-08-18 DIAGNOSIS — E780 Pure hypercholesterolemia: Secondary | ICD-10-CM | POA: Diagnosis present

## 2014-08-18 DIAGNOSIS — J80 Acute respiratory distress syndrome: Secondary | ICD-10-CM

## 2014-08-18 DIAGNOSIS — G8929 Other chronic pain: Secondary | ICD-10-CM | POA: Diagnosis present

## 2014-08-18 DIAGNOSIS — T380X5A Adverse effect of glucocorticoids and synthetic analogues, initial encounter: Secondary | ICD-10-CM | POA: Diagnosis present

## 2014-08-18 DIAGNOSIS — Z833 Family history of diabetes mellitus: Secondary | ICD-10-CM

## 2014-08-18 DIAGNOSIS — N138 Other obstructive and reflux uropathy: Secondary | ICD-10-CM

## 2014-08-18 DIAGNOSIS — Z96643 Presence of artificial hip joint, bilateral: Secondary | ICD-10-CM | POA: Diagnosis present

## 2014-08-18 DIAGNOSIS — N401 Enlarged prostate with lower urinary tract symptoms: Secondary | ICD-10-CM

## 2014-08-18 DIAGNOSIS — M160 Bilateral primary osteoarthritis of hip: Secondary | ICD-10-CM

## 2014-08-18 HISTORY — DX: Chronic obstructive pulmonary disease, unspecified: J44.9

## 2014-08-18 HISTORY — DX: Tobacco use: Z72.0

## 2014-08-18 HISTORY — DX: Other chronic pain: G89.29

## 2014-08-18 LAB — COMPREHENSIVE METABOLIC PANEL
ALK PHOS: 57 U/L (ref 39–117)
ALT: 13 U/L (ref 0–53)
ANION GAP: 9 (ref 5–15)
AST: 23 U/L (ref 0–37)
Albumin: 4.3 g/dL (ref 3.5–5.2)
BILIRUBIN TOTAL: 0.4 mg/dL (ref 0.3–1.2)
BUN: 16 mg/dL (ref 6–23)
CO2: 30 mmol/L (ref 19–32)
Calcium: 9.5 mg/dL (ref 8.4–10.5)
Chloride: 100 mmol/L (ref 96–112)
Creatinine, Ser: 1.23 mg/dL (ref 0.50–1.35)
GFR, EST AFRICAN AMERICAN: 68 mL/min — AB (ref >90)
GFR, EST NON AFRICAN AMERICAN: 59 mL/min — AB (ref >90)
GLUCOSE: 161 mg/dL — AB (ref 70–99)
POTASSIUM: 3.7 mmol/L (ref 3.5–5.1)
SODIUM: 139 mmol/L (ref 135–145)
TOTAL PROTEIN: 7.5 g/dL (ref 6.0–8.3)

## 2014-08-18 LAB — MRSA PCR SCREENING: MRSA by PCR: NEGATIVE

## 2014-08-18 LAB — BLOOD GAS, ARTERIAL
Acid-Base Excess: 0.3 mmol/L (ref 0.0–2.0)
BICARBONATE: 25.2 meq/L — AB (ref 20.0–24.0)
Drawn by: 234301
O2 Content: 3 L/min
O2 Saturation: 94.7 %
PATIENT TEMPERATURE: 37
PCO2 ART: 46.5 mmHg — AB (ref 35.0–45.0)
PH ART: 7.353 (ref 7.350–7.450)
TCO2: 22.9 mmol/L (ref 0–100)
pO2, Arterial: 83.3 mmHg (ref 80.0–100.0)

## 2014-08-18 LAB — CBC WITH DIFFERENTIAL/PLATELET
Basophils Absolute: 0.1 10*3/uL (ref 0.0–0.1)
Basophils Relative: 1 % (ref 0–1)
EOS ABS: 0.6 10*3/uL (ref 0.0–0.7)
EOS PCT: 5 % (ref 0–5)
HCT: 39.5 % (ref 39.0–52.0)
HEMOGLOBIN: 13 g/dL (ref 13.0–17.0)
LYMPHS ABS: 2.1 10*3/uL (ref 0.7–4.0)
LYMPHS PCT: 17 % (ref 12–46)
MCH: 31.6 pg (ref 26.0–34.0)
MCHC: 32.9 g/dL (ref 30.0–36.0)
MCV: 95.9 fL (ref 78.0–100.0)
MONOS PCT: 8 % (ref 3–12)
Monocytes Absolute: 1 10*3/uL (ref 0.1–1.0)
Neutro Abs: 8.3 10*3/uL — ABNORMAL HIGH (ref 1.7–7.7)
Neutrophils Relative %: 69 % (ref 43–77)
Platelets: 359 10*3/uL (ref 150–400)
RBC: 4.12 MIL/uL — AB (ref 4.22–5.81)
RDW: 13.1 % (ref 11.5–15.5)
WBC: 12.1 10*3/uL — AB (ref 4.0–10.5)

## 2014-08-18 LAB — TROPONIN I
Troponin I: <0.03 ng/mL (ref <0.031)
Troponin I: <0.03 ng/mL (ref <0.031)

## 2014-08-18 LAB — BRAIN NATRIURETIC PEPTIDE: B NATRIURETIC PEPTIDE 5: 24 pg/mL (ref 0.0–100.0)

## 2014-08-18 LAB — TSH: TSH: 0.548 u[IU]/mL (ref 0.350–4.500)

## 2014-08-18 MED ORDER — IPRATROPIUM BROMIDE 0.02 % IN SOLN
RESPIRATORY_TRACT | Status: AC
  Administered 2014-08-18: 0.5 mg
  Filled 2014-08-18: qty 2.5

## 2014-08-18 MED ORDER — METHYLPREDNISOLONE SODIUM SUCC 125 MG IJ SOLR
60.0000 mg | Freq: Four times a day (QID) | INTRAMUSCULAR | Status: DC
  Administered 2014-08-18 – 2014-08-21 (×11): 60 mg via INTRAVENOUS
  Filled 2014-08-18 (×9): qty 2

## 2014-08-18 MED ORDER — TIOTROPIUM BROMIDE MONOHYDRATE 18 MCG IN CAPS
18.0000 ug | ORAL_CAPSULE | Freq: Every day | RESPIRATORY_TRACT | Status: DC
  Administered 2014-08-18 – 2014-08-24 (×6): 18 ug via RESPIRATORY_TRACT
  Filled 2014-08-18 (×2): qty 5

## 2014-08-18 MED ORDER — ACETAMINOPHEN 650 MG RE SUPP: 650.0000 mg | Freq: Four times a day (QID) | RECTAL | Status: DC | PRN

## 2014-08-18 MED ORDER — TRAZODONE HCL 50 MG PO TABS
25.0000 mg | ORAL_TABLET | Freq: Every evening | ORAL | Status: DC | PRN
  Administered 2014-08-22: 25 mg via ORAL
  Filled 2014-08-18: qty 1

## 2014-08-18 MED ORDER — ALPRAZOLAM 0.5 MG PO TABS
0.5000 mg | ORAL_TABLET | Freq: Three times a day (TID) | ORAL | Status: DC | PRN
  Administered 2014-08-18 – 2014-08-24 (×9): 0.5 mg via ORAL
  Filled 2014-08-18 (×9): qty 1

## 2014-08-18 MED ORDER — IPRATROPIUM-ALBUTEROL 0.5-2.5 (3) MG/3ML IN SOLN
3.0000 mL | RESPIRATORY_TRACT | Status: DC
  Administered 2014-08-18: 3 mL via RESPIRATORY_TRACT
  Filled 2014-08-18: qty 3

## 2014-08-18 MED ORDER — SODIUM CHLORIDE 0.9 % IV SOLN
INTRAVENOUS | Status: DC
  Administered 2014-08-18 – 2014-08-20 (×3): via INTRAVENOUS
  Administered 2014-08-21: 1000 mL via INTRAVENOUS
  Administered 2014-08-22: 18:00:00 via INTRAVENOUS
  Administered 2014-08-22: 1 mL via INTRAVENOUS
  Administered 2014-08-23: 10:00:00 via INTRAVENOUS

## 2014-08-18 MED ORDER — ACETAMINOPHEN 325 MG PO TABS: 650.0000 mg | ORAL_TABLET | Freq: Four times a day (QID) | ORAL | Status: DC | PRN

## 2014-08-18 MED ORDER — LEVALBUTEROL HCL 0.63 MG/3ML IN NEBU
0.6300 mg | INHALATION_SOLUTION | RESPIRATORY_TRACT | Status: DC
  Administered 2014-08-18 (×3): 0.63 mg via RESPIRATORY_TRACT
  Filled 2014-08-18 (×3): qty 3

## 2014-08-18 MED ORDER — ENSURE COMPLETE PO LIQD
237.0000 mL | Freq: Two times a day (BID) | ORAL | Status: DC
  Administered 2014-08-18: 237 mL via ORAL

## 2014-08-18 MED ORDER — OXYCODONE-ACETAMINOPHEN 5-325 MG PO TABS
1.0000 | ORAL_TABLET | Freq: Four times a day (QID) | ORAL | Status: DC | PRN
  Administered 2014-08-18 – 2014-08-24 (×19): 1 via ORAL
  Filled 2014-08-18 (×19): qty 1

## 2014-08-18 MED ORDER — LEVOFLOXACIN IN D5W 750 MG/150ML IV SOLN
750.0000 mg | INTRAVENOUS | Status: DC
  Administered 2014-08-19: 750 mg via INTRAVENOUS
  Filled 2014-08-18: qty 150

## 2014-08-18 MED ORDER — ALBUTEROL (5 MG/ML) CONTINUOUS INHALATION SOLN
INHALATION_SOLUTION | RESPIRATORY_TRACT | Status: AC
  Administered 2014-08-18: 2 mL
  Filled 2014-08-18: qty 20

## 2014-08-18 MED ORDER — PRAVASTATIN SODIUM 40 MG PO TABS
40.0000 mg | ORAL_TABLET | Freq: Every day | ORAL | Status: DC
  Administered 2014-08-18 – 2014-08-24 (×7): 40 mg via ORAL
  Filled 2014-08-18 (×7): qty 1

## 2014-08-18 MED ORDER — LEVOFLOXACIN IN D5W 500 MG/100ML IV SOLN
500.0000 mg | Freq: Once | INTRAVENOUS | Status: AC
  Administered 2014-08-18: 500 mg via INTRAVENOUS
  Filled 2014-08-18: qty 100

## 2014-08-18 MED ORDER — NICOTINE 21 MG/24HR TD PT24
21.0000 mg | MEDICATED_PATCH | Freq: Every day | TRANSDERMAL | Status: DC
  Administered 2014-08-18 – 2014-08-24 (×7): 21 mg via TRANSDERMAL
  Filled 2014-08-18 (×8): qty 1

## 2014-08-18 MED ORDER — INSULIN ASPART 100 UNIT/ML ~~LOC~~ SOLN
0.0000 [IU] | Freq: Every day | SUBCUTANEOUS | Status: DC
  Administered 2014-08-23: 2 [IU] via SUBCUTANEOUS

## 2014-08-18 MED ORDER — ONDANSETRON HCL 4 MG PO TABS: 4.0000 mg | ORAL_TABLET | Freq: Four times a day (QID) | ORAL | Status: DC | PRN

## 2014-08-18 MED ORDER — ALBUTEROL SULFATE (2.5 MG/3ML) 0.083% IN NEBU: 2.5000 mg | INHALATION_SOLUTION | RESPIRATORY_TRACT | Status: DC

## 2014-08-18 MED ORDER — OXYCODONE-ACETAMINOPHEN 10-325 MG PO TABS: 1.0000 | ORAL_TABLET | Freq: Four times a day (QID) | ORAL | Status: DC | PRN

## 2014-08-18 MED ORDER — NAPROXEN 250 MG PO TABS
500.0000 mg | ORAL_TABLET | Freq: Two times a day (BID) | ORAL | Status: DC
  Administered 2014-08-18 – 2014-08-24 (×12): 500 mg via ORAL
  Filled 2014-08-18 (×12): qty 2

## 2014-08-18 MED ORDER — OXYCODONE HCL 5 MG PO TABS
5.0000 mg | ORAL_TABLET | Freq: Four times a day (QID) | ORAL | Status: DC | PRN
  Administered 2014-08-18 – 2014-08-24 (×16): 5 mg via ORAL
  Filled 2014-08-18 (×16): qty 1

## 2014-08-18 MED ORDER — ENOXAPARIN SODIUM 40 MG/0.4ML ~~LOC~~ SOLN
40.0000 mg | SUBCUTANEOUS | Status: DC
  Administered 2014-08-18 – 2014-08-24 (×7): 40 mg via SUBCUTANEOUS
  Filled 2014-08-18 (×7): qty 0.4

## 2014-08-18 MED ORDER — METHYLPREDNISOLONE SODIUM SUCC 125 MG IJ SOLR
125.0000 mg | Freq: Once | INTRAMUSCULAR | Status: AC
  Administered 2014-08-18: 125 mg via INTRAVENOUS
  Filled 2014-08-18: qty 2

## 2014-08-18 MED ORDER — ALUM & MAG HYDROXIDE-SIMETH 200-200-20 MG/5ML PO SUSP: 30.0000 mL | Freq: Four times a day (QID) | ORAL | Status: DC | PRN

## 2014-08-18 MED ORDER — INSULIN ASPART 100 UNIT/ML ~~LOC~~ SOLN
0.0000 [IU] | Freq: Three times a day (TID) | SUBCUTANEOUS | Status: DC
  Administered 2014-08-18 (×2): 5 [IU] via SUBCUTANEOUS
  Administered 2014-08-19 (×2): 2 [IU] via SUBCUTANEOUS
  Administered 2014-08-20: 3 [IU] via SUBCUTANEOUS
  Administered 2014-08-20 – 2014-08-24 (×8): 2 [IU] via SUBCUTANEOUS

## 2014-08-18 MED ORDER — GUAIFENESIN-DM 100-10 MG/5ML PO SYRP: 5.0000 mL | ORAL_SOLUTION | ORAL | Status: DC | PRN

## 2014-08-18 MED ORDER — ONDANSETRON HCL 4 MG/2ML IJ SOLN: 4.0000 mg | Freq: Four times a day (QID) | INTRAMUSCULAR | Status: DC | PRN

## 2014-08-18 MED ORDER — ALBUTEROL SULFATE (2.5 MG/3ML) 0.083% IN NEBU
5.0000 mg | INHALATION_SOLUTION | Freq: Once | RESPIRATORY_TRACT | Status: AC
  Administered 2014-08-18: 5 mg via RESPIRATORY_TRACT
  Filled 2014-08-18: qty 6

## 2014-08-18 MED ORDER — METHYLPREDNISOLONE SODIUM SUCC 40 MG IJ SOLR
40.0000 mg | Freq: Four times a day (QID) | INTRAMUSCULAR | Status: DC
  Administered 2014-08-18: 40 mg via INTRAVENOUS
  Filled 2014-08-18: qty 1

## 2014-08-18 NOTE — ED Notes (Signed)
Patient was having worsening shortness of breath. Was given a breathing treatment en route to the hospital and IV was started. Patient has been taking azithromycin that his family doctor called in and it has not helped.

## 2014-08-18 NOTE — ED Provider Notes (Signed)
CSN: [id number]     Arrival date & time 08/18/14  9562 History  This chart was scribed for [facility] R. Rubin Payor, MD by Abel Presto, ED Scribe. This patient was seen in room APA10/APA10 and the patient's care was started at 7:15 AM.    Chief Complaint  Patient presents with  . Shortness of Breath     Patient is a 69 y.o. male presenting with shortness of breath. The history is provided by the patient. No language interpreter was used.  Shortness of Breath Associated symptoms: cough and fever   Associated symptoms: no chest pain and no vomiting    HPI Comments: Terry Herman is a 69 y.o. male brought in by ambulance, with PMHx of HTN and HLD who presents to the Emergency Department complaining of  worsening SOB for a week. Pt was seen by PCP last month and was prescribed a Z-pack. Pt completed the course yesterday.  Pt notes associated fever, productive cough, and loss of appetite. Pt states he has lost some weight from decreased appetite. Pt notes dyspnea on exertion. Pt is a smoker. Pt denies chest pain, swelling in legs, nausea, diarrhea, constipation, and difficulty urinating.  Pt on 3L O2 in exam room. Pt's PCP is Dr. Janna Arch.   Past Medical History  Diagnosis Date  . Hypertension   . Hypercholesterolemia   . Hip pain     s/p left hip surgery Nov 4th with Dr. Romeo Apple  . Shortness of breath     exertion  . Arthritis   . Anemia    Past Surgical History  Procedure Laterality Date  . None    . 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 Total hip arthroplasty  04/16/2012    Procedure: TOTAL HIP ARTHROPLASTY;  Surgeon: Vickki Hearing, MD;  Location: [facility];  Service: Orthopedics;  Laterality: Left;  . Colonoscopy with propofol  06/26/2012    ZHY:QMVHQI mucosa in the terminal ileum otherwise normal colon/small internal hemorrhoids  . Esophagogastroduodenoscopy (egd) with propofol  06/26/2012    ONG:EXBMWU erosion/erosive gastritis/duodenitis negative path for H.pylori  .  Esophageal biopsy  06/26/2012    Procedure: BIOPSY;  Surgeon: West Bali, MD;  Location: [facility];  Service: Endoscopy;  Laterality: N/A;  gastric biopsy  . Joint replacement Left 2013    LEFT THA   . Total hip arthroplasty Right 08/06/2012    Procedure: TOTAL HIP ARTHROPLASTY;  Surgeon: Vickki Hearing, MD;  Location: [facility];  Service: Orthopedics;  Laterality: Right;  . Hip surgery     Family History  Problem Relation Age of Onset  . Arthritis    . Cancer    . Diabetes    . Colon cancer Neg Hx    History  Substance Use Topics  . Smoking status: Current Every Day Smoker -- 1.00 packs/day for 50 years    Types: Cigarettes  . Smokeless tobacco: Not on file  . Alcohol Use: No     Comment: No ETOH in past 5 years, was previously an alcoholic.     Review of Systems  Constitutional: Positive for fever and appetite change.  Respiratory: Positive for cough and shortness of breath.   Cardiovascular: Negative for chest pain and leg swelling.  Gastrointestinal: Positive for constipation. Negative for nausea, vomiting and diarrhea.  Genitourinary: Negative for difficulty urinating.      Allergies  Review of patient's allergies indicates no known allergies.  Home Medications   Prior to Admission medications   Medication  Sig Start Date End Date Taking? Authorizing Provider  amLODipine (NORVASC) 10 MG tablet Take 10 mg by mouth daily. Takes along with 5 mg tab    Historical Provider, MD  amLODipine (NORVASC) 5 MG tablet Take 5 mg by mouth daily. Takes along with 10 mg tab 06/21/12   Historical Provider, MD  aspirin EC 325 MG EC tablet Take 1 tablet (325 mg total) by mouth daily. 08/09/12   Vickki Hearing, MD  cyclobenzaprine (FLEXERIL) 10 MG tablet Take 1 tablet (10 mg total) by mouth every 8 (eight) hours. 02/05/13   Vickki Hearing, MD  Linaclotide [facility]) 290 MCG CAPS Take 1 capsule by mouth daily. 12/05/12   Nira Retort, NP  lisinopril (PRINIVIL,ZESTRIL) 10 MG tablet Take  10 mg by mouth daily.  11/27/12   Historical Provider, MD  lubiprostone (AMITIZA) 24 MCG capsule Take 1 capsule (24 mcg total) by mouth 2 (two) times daily with a meal. 12/13/12   Nira Retort, NP  methocarbamol (ROBAXIN) 500 MG tablet Take 1 tablet (500 mg total) by mouth 4 (four) times daily. 10/30/12   Vickki Hearing, MD  naproxen (NAPROSYN) 500 MG tablet Take 500 mg by mouth 2 (two) times daily with a meal.  12/01/12   Historical Provider, MD  nicotine (NICODERM CQ - DOSED IN MG/24 HR) 7 mg/24hr patch Place 1 patch onto the skin daily. 08/09/12   Vickki Hearing, MD  omeprazole (PRILOSEC) 20 MG capsule 1 po bid 30 minutes before meals 06/26/12   West Bali, MD  oxyCODONE-acetaminophen (PERCOCET) 10-325 MG per tablet Take 1 tablet by mouth every 6 (six) hours as needed.  11/29/12   Historical Provider, MD  pravastatin (PRAVACHOL) 40 MG tablet Take 40 mg by mouth daily.  01/10/12   Historical Provider, MD  senna-docusate (SENOKOT-S) 8.6-50 MG per tablet Take 1 tablet by mouth 2 (two) times daily. 08/09/12   Vickki Hearing, MD   BP 184/72 mmHg  Pulse 135  Temp(Src) 97.8 F (36.6 C) (Oral)  Resp 33  Ht  (1.803 m)  Wt 118 lb (53.524 kg)  BMI 16.46 kg/m2  SpO2 94% Physical Exam  Constitutional: He is oriented to person, place, and time. He appears well-developed and well-nourished.  HENT:  Head: Normocephalic.  Eyes: Conjunctivae are normal.  Neck: Normal range of motion. Neck supple. No JVD present.  Pulmonary/Chest: Effort normal. He has wheezes. He has no rhonchi. He has no rales.  chronic supraclavicular retractions; intercostal retractions   Musculoskeletal: Normal range of motion. He exhibits no edema.  Neurological: He is alert and oriented to person, place, and time.  Skin: Skin is warm and dry.  Psychiatric: He has a normal mood and affect. His behavior is normal.  Nursing note and vitals reviewed.   ED Course  Procedures (including critical care time) DIAGNOSTIC  STUDIES: Oxygen Saturation is 97% on room air, normal by my interpretation.    COORDINATION OF CARE: 7:18 AM Discussed treatment plan with patient at beside, the patient agrees with the plan and has no further questions at this time.   Labs Review Labs Reviewed  CBC WITH DIFFERENTIAL/PLATELET  COMPREHENSIVE METABOLIC PANEL  TROPONIN I  BRAIN NATRIURETIC PEPTIDE    Imaging Review No results found.   EKG Interpretation   Date/Time:  Monday August 18 2014 06:30:43 EST Ventricular Rate:  139 PR Interval:  138 QRS Duration: 87 QT Interval:  285 QTC Calculation: 433 R Axis:   -80 Text Interpretation:  Sinus tachycardia Consider right atrial enlargement  Left axis deviation Anterior infarct, old No significant change was found  Confirmed by CAMPOS  MD, Caryn BeeKEVIN ([id number]) on 08/18/2014 6:34:20 AM      MDM   Final diagnoses:  None   patient with shortness of breath and cough. Diffuse wheezes. Tachypnea and tachycardia. Patient feels somewhat better after breathing treatment but continues to be tachycardic. Will admit to internal medicine. History of smoking. Likely COPD exacerbation. Has been on Z-Pak and will give Levaquin now.  I personally performed the services described in this documentation, which was scribed in my presence. The recorded information has been reviewed and is accurate.      Benjiman CoreNathan Georgeanne Frankland, MD 08/18/14 1034

## 2014-08-18 NOTE — ED Notes (Signed)
Repaged Hospitalist at [phone] to [phone]

## 2014-08-18 NOTE — H&P (Signed)
[facility] History and Physical  Terry Herman ZOX:[id number] DOB: 07-03-1945 DOA: 08/18/2014  Referring physician:  PCP: Terry Herman   Chief Complaint: shortness of breath  HPI: Terry Herman is a 69 y.o. male with a past medical history that includes emphysema remotely no longer on oxygen or requiring inhalers or nebulizers, hypertension, tobacco use, bilateral hip replacement 6 months ago presents to the emergency department with the chief complaint of worsening shortness of breath. Initial evaluation in the emergency department reveals acute respiratory failure with tachypnea, tachycardia. Patient reports 2 weeks ago he developed shortness of breath with exertion. He called his PCP and ordered a Z-Pak which he completed yesterday. During this time he reports getting no better in the last 2 days shortness of breath worsened. Associated symptoms include subjective fever, wet nonproductive cough, decreased by mouth intake, generalized weakness. He denies chest pain palpitation headache syncope or near-syncope. Denies any abdominal pain nausea vomiting diarrhea constipation or lower extremity edema. Also reports that "years ago" he was diagnosed with emphysema and for a while he was on oxygen and required nebulizer treatments at home. He reports no longer requiring those therapies. He denies shortness of breath with exertion at his baseline reporting that last fall he played golf 3-4 times a week. Workup in the emergency department includes a chest x-ray with airway thickening suggesting bronchitis or reactive disease, complete blood count significant for WBCs of 12.1 basic metabolic panel significant for serum glucose of 161 otherwise unremarkable. Initial troponin negative, BNP 24. At the time of this dictation ABG is pending. EKG sinus tachycardia consider right atrial enlargement left axis deviation anterior infarct, old no significant change was found He is afebrile, blood pressure  is 135/58 heart rate 128 respirations 38. He is given 125 mg of Solu-Medrol, nebulizers 2 and Levaquin in the emergency department. He reports breathing somewhat easier.   Review of Systems:  10 point review of systems complete and all systems are negative except as indicated in the history of present illness   Past Medical History  Diagnosis Date  . Hypertension   . Hypercholesterolemia   . Hip pain     s/p left hip surgery Nov 4th with Dr. Romeo AppleHarrison  . Shortness of breath     exertion  . Arthritis   . Anemia   . Chronic pain     s/p bilateral hip replacement 2015  . Tobacco use   . COPD (chronic obstructive pulmonary disease)     dx with "emphysema" remotely   Past Surgical History  Procedure Laterality Date  . None    . 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 Total hip arthroplasty  04/16/2012    Procedure: TOTAL HIP ARTHROPLASTY;  Surgeon: Vickki HearingStanley E Harrison, Herman;  Location: [facility];  Service: Orthopedics;  Laterality: Left;  . Colonoscopy with propofol  06/26/2012    WJX:BJYNWGSLF:normal mucosa in the terminal ileum otherwise normal colon/small internal hemorrhoids  . Esophagogastroduodenoscopy (egd) with propofol  06/26/2012    NFA:OZHYQMSLF:linear erosion/erosive gastritis/duodenitis negative path for H.pylori  . Esophageal biopsy  06/26/2012    Procedure: BIOPSY;  Surgeon: West BaliSandi L Fields, Herman;  Location: [facility];  Service: Endoscopy;  Laterality: N/A;  gastric biopsy  . Joint replacement Left 2013    LEFT THA   . Total hip arthroplasty Right 08/06/2012    Procedure: TOTAL HIP ARTHROPLASTY;  Surgeon: Vickki HearingStanley E Harrison, Herman;  Location: [facility];  Service: Orthopedics;  Laterality: Right;  . Hip surgery  Social History:  reports that he has been smoking Cigarettes.  He has a 50 pack-year smoking history. He does not have any smokeless tobacco history on file. He reports that he does not drink alcohol or use illicit drugs. He is married he lives at home with his wife he is independent with  ADLs No Known Allergies  Family History  Problem Relation Age of Onset  . Arthritis    . Cancer    . Diabetes    . Colon cancer Neg Hx      Prior to Admission medications   Medication Sig Start Date End Date Taking? Authorizing Provider  amLODipine (NORVASC) 10 MG tablet Take 20 mg by mouth daily.    Yes Historical Provider, Herman  lisinopril (PRINIVIL,ZESTRIL) 10 MG tablet Take 10 mg by mouth daily.  11/27/12  Yes Historical Provider, Herman  naproxen (NAPROSYN) 500 MG tablet Take 500 mg by mouth 2 (two) times daily with a meal.  12/01/12  Yes Historical Provider, Herman  oxyCODONE-acetaminophen (PERCOCET) 10-325 MG per tablet Take 1 tablet by mouth every 6 (six) hours as needed.  11/29/12  Yes Historical Provider, Herman  pravastatin (PRAVACHOL) 40 MG tablet Take 40 mg by mouth daily.  01/10/12  Yes Historical Provider, Herman  aspirin EC 325 MG EC tablet Take 1 tablet (325 mg total) by mouth daily. Patient not taking: Reported on 08/18/2014 08/09/12   Vickki Hearing, Herman  cyclobenzaprine (FLEXERIL) 10 MG tablet Take 1 tablet (10 mg total) by mouth every 8 (eight) hours. Patient not taking: Reported on 08/18/2014 02/05/13   Vickki Hearing, Herman  Linaclotide [facility]) 290 MCG CAPS Take 1 capsule by mouth daily. Patient not taking: Reported on 08/18/2014 12/05/12   Nira Retort, NP  lubiprostone (AMITIZA) 24 MCG capsule Take 1 capsule (24 mcg total) by mouth 2 (two) times daily with a meal. Patient not taking: Reported on 08/18/2014 12/13/12   Nira Retort, NP  methocarbamol (ROBAXIN) 500 MG tablet Take 1 tablet (500 mg total) by mouth 4 (four) times daily. Patient not taking: Reported on 08/18/2014 10/30/12   Vickki Hearing, Herman  nicotine (NICODERM CQ - DOSED IN MG/24 HR) 7 mg/24hr patch Place 1 patch onto the skin daily. Patient not taking: Reported on 08/18/2014 08/09/12   Vickki Hearing, Herman  omeprazole (PRILOSEC) 20 MG capsule 1 po bid 30 minutes before meals Patient not taking: Reported on 08/18/2014 06/26/12    West Bali, Herman   Physical Exam: Filed Vitals:   08/18/14 0730 08/18/14 0742 08/18/14 0922 08/18/14 0929  BP: 145/82  140/72   Pulse: 133  125   Temp:  97.9 F (36.6 C) 97.8 F (36.6 C)   TempSrc:   Oral   Resp: 27  38   Height:      Weight:      SpO2: 96% 95% 100% 100%    Wt Readings from Last 3 Encounters:  08/18/14 53.524 kg (118 lb)  02/05/13 55.339 kg (122 lb)  12/05/12 55.792 kg (123 lb)    General:  Thin somewhat frail-appearing appears comfortable Eyes: PERRL, normal lids, irises & conjunctiva ENT: grossly normal hearing, his membranes of his mouth slightly dry but pink Neck: no LAD, masses or thyromegaly Cardiovascular: Tachycardic but regular no m/r/g. No LE edema. Respiratory: Mild increased work of breathing with conversation. He is able to complete sentences. Breath sounds are quite distant with faint end expiratory wheezing throughout. I hear no crackles. He does use accessory  muscles.. Abdomen: soft, ntnd positive bowel sounds no guarding or rebounding Skin: no rash or induration seen on limited exam Musculoskeletal: grossly normal tone BUE/BLE Psychiatric: grossly normal mood and affect, speech fluent and appropriate Neurologic: grossly non-focal. Speech is clear facial symmetry           Labs on Admission:  Basic Metabolic Panel:  Recent Labs Lab 08/18/14 0719  NA 139  K 3.7  CL 100  CO2 30  GLUCOSE 161*  BUN 16  CREATININE 1.23  CALCIUM 9.5   Liver Function Tests:  Recent Labs Lab 08/18/14 0719  AST 23  ALT 13  ALKPHOS 57  BILITOT 0.4  PROT 7.5  ALBUMIN 4.3   No results for input(s): LIPASE, AMYLASE in the last 168 hours. No results for input(s): AMMONIA in the last 168 hours. CBC:  Recent Labs Lab 08/18/14 0719  WBC 12.1*  NEUTROABS 8.3*  HGB 13.0  HCT 39.5  MCV 95.9  PLT 359   Cardiac Enzymes:  Recent Labs Lab 08/18/14 0719  TROPONINI <0.03    BNP (last 3 results)  Recent Labs  08/18/14 0719  BNP 24.0     ProBNP (last 3 results) No results for input(s): PROBNP in the last 8760 hours.  CBG: No results for input(s): GLUCAP in the last 168 hours.  Radiological Exams on Admission: Dg Chest Portable 1 View  08/18/2014   CLINICAL DATA:  Shortness of breath.  Duration:  1 week  EXAM: PORTABLE CHEST - 1 VIEW  COMPARISON:  08/31/2005  FINDINGS: Airway thickening is present, suggesting bronchitis or reactive airways disease. Cardiac and mediastinal margins appear normal. Old healed left rib fractures. No pleural effusion identified.  IMPRESSION: 1. Airway thickening is present, suggesting bronchitis or reactive airways disease.   Electronically Signed   By: Gaylyn Rong M.D.   On: 08/18/2014 07:29    EKG: Independently reviewed his tachycardia  Assessment/Plan Principal Problem:   Acute respiratory failure: In  patient that continues to smoke and has a remote history of emphysema no longer requiring home oxygen or nebulizers. Will admit to step down. Will obtain an ABG to determine exactly what is CO2 is. Her story effort improved since nebulizers and Solu-Medrol in the emergency department. Continue duo nebs, Solu-Medrol, and Levaquin. So provide flutter valve. At time of my exam oxygen saturation level 98% on 4L. Will titrate as indicated.  Active Problems: COPD exacerbation: Trigger unclear. Patient reports a remote diagnosis of emphysema. He does continue to smoke. See #1. May benefit from outpatient pulmonary function tests once acute illness resolved.    Tachycardia: Likely related to nebulizers in setting of mild dehydration. Will gently hydrate. Will provide when necessary metoprolol as blood pressure allows. initial troponin negative we'll cycle for completeness.  Hypertension. I medications include amlodipine and lisinopril. Currently his blood pressure stable he has not had his meds today. Will hold these for now monitor closely. Beta blocker as noted above.    Hyperglycemia: Likely  steroid-induced. Will go ahead and get a hemoglobin A1c and use sliding scale insulin for optimal control given that we will be continuing Solu-Medrol.    Tobacco abuse: Cessation counseling offered. Nicotine patch    Chronic pain: Status post bilateral hip replacement 6 months ago. Patient reports he sees Dr. Delbert Harness monthly for refills on his pain medicine. Will continue this.    AVN (avascular necrosis of bone): Status post bilateral hip replacements. He reports not needing a cane or walker and no recent falls.  Have physical therapy evaluate tomorrow.     Code Status: full DVT Prophylaxis: Family Communication: son at bedside Disposition Plan: home when ready  Time spent: 59 minutes  [facility] Pager [phone]

## 2014-08-18 NOTE — Progress Notes (Signed)
ANTIBIOTIC CONSULT NOTE  Pharmacy Consult for Levaquin Indication: COPD exacerbation  No Known Allergies  Patient Measurements: Height: 5\' 11"  (180.3 cm) Weight: 118 lb (53.524 kg) IBW/kg (Calculated) : 75.3  Vital Signs: Temp: 97.6 F (36.4 C) (03/07 1100) Temp Source: Oral (03/07 1100) BP: 135/58 mmHg (03/07 1027) Pulse Rate: 129 (03/07 1027) Intake/Output from previous day:   Intake/Output from this shift:    Labs:  Recent Labs  08/18/14 0719  WBC 12.1*  HGB 13.0  PLT 359  CREATININE 1.23   Estimated Creatinine Clearance: 44.1 mL/min (by C-G formula based on Cr of 1.23). No results for input(s): VANCOTROUGH, VANCOPEAK, VANCORANDOM, GENTTROUGH, GENTPEAK, GENTRANDOM, TOBRATROUGH, TOBRAPEAK, TOBRARND, AMIKACINPEAK, AMIKACINTROU, AMIKACIN in the last 72 hours.   Microbiology: No results found for this or any previous visit (from the past 720 hour(s)).  Anti-infectives    Start     Dose/Rate Route Frequency Ordered Stop   08/19/14 1000  levofloxacin (LEVAQUIN) IVPB 750 mg     750 mg 100 mL/hr over 90 Minutes Intravenous Every 48 hours 08/18/14 1131     08/18/14 0845  levofloxacin (LEVAQUIN) IVPB 500 mg     500 mg 100 mL/hr over 60 Minutes Intravenous  Once 08/18/14 [id number] 08/18/14 [id number]      Assessment: 69 yo M with COPD exacerbation started on Levaquin in ED. Mild leukocytosis, afebrile. Estimated CrCl ~ 3545ml/min  Levaquin 3/7>>  Goal of Therapy:  Eradicate infection.  Plan:  Levaquin 750mg  IV q48h- to start 3/8 Change to PO once appropriate Monitor renal fxn  Duration of therapy per MD- recommend 5 days  Elson ClanLilliston, Justin Buechner Michelle 08/18/2014,11:50 AM

## 2014-08-18 NOTE — Care Management Utilization Note (Signed)
UR completed 

## 2014-08-18 NOTE — ED Provider Notes (Signed)
MSE was initiated and I personally evaluated the patient and placed orders (if any) at  6:44 AM on August 18, 2014.  The patient appears stable so that the remainder of the MSE may be completed by another provider.   EKG Interpretation  Date/Time:  Monday August 18 2014 06:30:43 EST Ventricular Rate:  139 PR Interval:  138 QRS Duration: 87 QT Interval:  285 QTC Calculation: 433 R Axis:   -80 Text Interpretation:  Sinus tachycardia Consider right atrial enlargement Left axis deviation Anterior infarct, old No significant change was found Confirmed by Drinda Belgard  MD, Caryn BeeKEVIN ([id number]) on 08/18/2014 6:34:20 AM      Despite azithromycin the patient continues to feel short of breath.  This was prescribed by his primary care physician.  Productive cough.  Labs, chest x-ray, Solu-Medrol.  Started on albuterol and Atrovent by respiratory  Lyanne CoKevin M Kimball Appleby, MD 08/18/14 [phone]

## 2014-08-18 NOTE — Progress Notes (Signed)
In and out cath performed per protocol.  700clear yellow cc's of urine returned

## 2014-08-18 NOTE — Progress Notes (Signed)
Pt not voiding. Has not voided since admission.  Bladder scan revealed greater than 877 cc of urine.  Pt advised to attempt to urinate. If unable will let MD know

## 2014-08-19 LAB — GLUCOSE, CAPILLARY
GLUCOSE-CAPILLARY: 210 mg/dL — AB (ref 70–99)
Glucose-Capillary: 202 mg/dL — ABNORMAL HIGH (ref 70–99)
Glucose-Capillary: 167 mg/dL — ABNORMAL HIGH (ref 70–99)
Glucose-Capillary: 144 mg/dL — ABNORMAL HIGH (ref 70–99)
Glucose-Capillary: 106 mg/dL — ABNORMAL HIGH (ref 70–99)

## 2014-08-19 LAB — BASIC METABOLIC PANEL
Anion gap: 6 (ref 5–15)
BUN: 23 mg/dL (ref 6–23)
CALCIUM: 9.1 mg/dL (ref 8.4–10.5)
CO2: 30 mmol/L (ref 19–32)
Chloride: 102 mmol/L (ref 96–112)
Creatinine, Ser: 1.15 mg/dL (ref 0.50–1.35)
GFR calc Af Amer: 74 mL/min — ABNORMAL LOW (ref >90)
GFR calc non Af Amer: 64 mL/min — ABNORMAL LOW (ref >90)
Glucose, Bld: 157 mg/dL — ABNORMAL HIGH (ref 70–99)
POTASSIUM: 4.3 mmol/L (ref 3.5–5.1)
SODIUM: 138 mmol/L (ref 135–145)

## 2014-08-19 LAB — CBC
HCT: 30.5 % — ABNORMAL LOW (ref 39.0–52.0)
Hemoglobin: 10.2 g/dL — ABNORMAL LOW (ref 13.0–17.0)
MCH: 32.2 pg (ref 26.0–34.0)
MCHC: 33.4 g/dL (ref 30.0–36.0)
MCV: 96.2 fL (ref 78.0–100.0)
PLATELETS: 315 10*3/uL (ref 150–400)
RBC: 3.17 MIL/uL — ABNORMAL LOW (ref 4.22–5.81)
RDW: 13.2 % (ref 11.5–15.5)
WBC: 13.4 10*3/uL — ABNORMAL HIGH (ref 4.0–10.5)

## 2014-08-19 LAB — HEMOGLOBIN A1C
Hgb A1c MFr Bld: 5.9 % — ABNORMAL HIGH (ref 4.8–5.6)
Mean Plasma Glucose: 123 mg/dL

## 2014-08-19 MED ORDER — LEVOFLOXACIN IN D5W 750 MG/150ML IV SOLN: 750.0000 mg | INTRAVENOUS | Status: DC

## 2014-08-19 MED ORDER — LEVALBUTEROL HCL 0.63 MG/3ML IN NEBU
0.6300 mg | INHALATION_SOLUTION | Freq: Four times a day (QID) | RESPIRATORY_TRACT | Status: DC
  Administered 2014-08-19 – 2014-08-24 (×22): 0.63 mg via RESPIRATORY_TRACT
  Filled 2014-08-19 (×22): qty 3

## 2014-08-19 MED ORDER — LEVOFLOXACIN IN D5W 750 MG/150ML IV SOLN
750.0000 mg | INTRAVENOUS | Status: DC
  Administered 2014-08-20 – 2014-08-21 (×2): 750 mg via INTRAVENOUS
  Filled 2014-08-19 (×2): qty 150

## 2014-08-19 MED ORDER — TAMSULOSIN HCL 0.4 MG PO CAPS
0.4000 mg | ORAL_CAPSULE | Freq: Every day | ORAL | Status: DC
  Administered 2014-08-19 – 2014-08-24 (×6): 0.4 mg via ORAL
  Filled 2014-08-19 (×6): qty 1

## 2014-08-19 NOTE — Progress Notes (Addendum)
Patient attempted to void several times this am without success.  Unable to void since in and out cath at 0001 this am.  Bladder scan revealed >75338ml urine in bladder. MD notified. New orders received and carried out to start Flomax and insert indwelling foley catheter. Schonewitz, Candelaria StagersLeigh Anne 08/19/2014

## 2014-08-19 NOTE — Progress Notes (Signed)
INITIAL NUTRITION ASSESSMENT  DOCUMENTATION CODES Per approved criteria  -Not Applicable   INTERVENTION: D/c Ensure Complete po BID, each supplement provides 350 kcal and 13 grams of protein   Provide snack between meals BID  Standing weight obtained to confirm weight changes  NUTRITION DIAGNOSIS: Predicted suboptimal protein-energy intake related to COPD exacerbation/respiratory failure as evidenced by pt reports decreased appetite and po intake with increased breathing difficulty.  Goal: Pt to meet >/= 90% of their estimated nutrition needs    Monitor:  Meal and supplement intake, I/O's, labs and weights  Reason for Assessment: Malnutrition Screen   69 y.o. male  Admitting Dx: Acute respiratory distress  ASSESSMENT: Pt has hx of COPD. Pt has not been eating well the past 2 weeks. 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 His primary beverage of choice is black coffee. He weighed 118# on his last visit to Dr Otilio Saberondiego's office.  Pt doesn't like the nutrition supplements and therefore will d/c. Encouraged him to eat snack between meals due to his limited meal intake. Nutrition services staff aware and will obtain his preference.  Nutrition focused physical exam: no physical signs of malnutrition at this time.  Height: Ht Readings from Last 1 Encounters:  08/18/14 5\' 11"  (1.803 m)    Weight: Wt Readings from Last 1 Encounters:  08/19/14 115 lb 11.9 oz (52.5 kg)  Reweight obtained (standing) -118#  Ideal Body Weight: 142# (64.5 kg)  % Ideal Body Weight: 82%  Wt Readings from Last 10 Encounters:  08/19/14 115 lb 11.9 oz (52.5 kg)  02/05/13 122 lb (55.339 kg)  12/05/12 123 lb (55.792 kg)  10/04/12 122 lb (55.339 kg)  09/03/12 122 lb (55.339 kg)  08/20/12 122 lb (55.339 kg)  08/13/12 122 lb (55.339 kg)  07/11/12 119 lb (53.978 kg)  05/22/12 120 lb 9.6 oz (54.704 kg)  04/30/12 122 lb (55.339 kg)    Usual Body Weight:  118-122#  % Usual Body Weight: 100%  BMI:  Body mass index is 16.15 kg/(m^2). underweight  Estimated Nutritional Needs: Kcal: 1800-2000 Protein: 80-90 gr Fluid: 1600 ml daily  Skin: intact  Diet Order: Diet Carb Modified (50% of recent meals)  EDUCATION NEEDS: -No education needs identified at this time   Intake/Output Summary (Last 24 hours) at 08/19/14 1230 Last data filed at 08/19/14 0900  Gross per 24 hour  Intake 2047.5 ml  Output    700 ml  Net 1347.5 ml    Last BM: 08/15/14  Labs:   Recent Labs Lab 08/18/14 0719 08/19/14 0459  NA 139 138  K 3.7 4.3  CL 100 102  CO2 30 30  BUN 16 23  CREATININE 1.23 1.15  CALCIUM 9.5 9.1  GLUCOSE 161* 157*    CBG (last 3)   Recent Labs  08/18/14 2110 08/19/14 0733 08/19/14 1110  GLUCAP 167* 144* 106*    Scheduled Meds: . enoxaparin (LOVENOX) injection  40 mg Subcutaneous Q24H  . feeding supplement (ENSURE COMPLETE)  237 mL Oral BID BM  . insulin aspart  0-15 Units Subcutaneous TID WC  . insulin aspart  0-5 Units Subcutaneous QHS  . levalbuterol  0.63 mg Nebulization Q6H  . levofloxacin (LEVAQUIN) IV  750 mg Intravenous Q48H  . methylPREDNISolone (SOLU-MEDROL) injection  60 mg Intravenous Q6H  . naproxen  500 mg Oral BID WC  . nicotine  21 mg Transdermal Daily  . pravastatin  40 mg Oral Daily  . tamsulosin  0.4 mg Oral  Daily  . tiotropium  18 mcg Inhalation Daily    Continuous Infusions: . sodium chloride 70 mL/hr at 08/19/14 0900    Past Medical History  Diagnosis Date  . Hypertension   . Hypercholesterolemia   . Hip pain     s/p left hip surgery Nov 4th with Dr. Romeo Apple  . Shortness of breath     exertion  . Arthritis   . Anemia   . Chronic pain     s/p bilateral hip replacement 2015  . Tobacco use   . COPD (chronic obstructive pulmonary disease)     dx with "emphysema" remotely    Past Surgical History  Procedure Laterality Date  . None    . 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 Total hip arthroplasty  04/16/2012    Procedure: TOTAL HIP ARTHROPLASTY;  Surgeon: Vickki Hearing, MD;  Location: [facility];  Service: Orthopedics;  Laterality: Left;  . Colonoscopy with propofol  06/26/2012    ZOX:WRUEAV mucosa in the terminal ileum otherwise normal colon/small internal hemorrhoids  . Esophagogastroduodenoscopy (egd) with propofol  06/26/2012    WUJ:WJXBJY erosion/erosive gastritis/duodenitis negative path for H.pylori  . Esophageal biopsy  06/26/2012    Procedure: BIOPSY;  Surgeon: West Bali, MD;  Location: [facility];  Service: Endoscopy;  Laterality: N/A;  gastric biopsy  . Joint replacement Left 2013    LEFT THA   . Total hip arthroplasty Right 08/06/2012    Procedure: TOTAL HIP ARTHROPLASTY;  Surgeon: Vickki Hearing, MD;  Location: [facility];  Service: Orthopedics;  Laterality: Right;  . Hip surgery      Royann Shivers MS,RD,CSG,LDN Office: #[phone] Pager: [phone]

## 2014-08-19 NOTE — Progress Notes (Signed)
ANTIBIOTIC CONSULT NOTE  Pharmacy Consult for Levaquin Indication: COPD exacerbation  No Known Allergies  Patient Measurements: Height: 5\' 11"  (180.3 cm) Weight: 118 lb (53.524 kg) (taken on stand up scale per dietician request) IBW/kg (Calculated) : 75.3  Vital Signs: Temp: 97.9 F (36.6 C) (03/08 1124) Temp Source: Oral (03/08 1124) BP: 143/46 mmHg (03/08 1300) Pulse Rate: 110 (03/08 1000) Intake/Output from previous day: 03/07 0701 - 03/08 0700 In: 1757.5 [P.O.:480; I.V.:1277.5] Out: 700 [Urine:700] Intake/Output from this shift: Total I/O In: 570 [I.V.:420; IV Piggyback:150] Out: 800 [Urine:800]  Labs:  Recent Labs  08/18/14 0719 08/19/14 0459  WBC 12.1* 13.4*  HGB 13.0 10.2*  PLT 359 315  CREATININE 1.23 1.15   Estimated Creatinine Clearance: 47.2 mL/min (by C-G formula based on Cr of 1.15). No results for input(s): VANCOTROUGH, VANCOPEAK, VANCORANDOM, GENTTROUGH, GENTPEAK, GENTRANDOM, TOBRATROUGH, TOBRAPEAK, TOBRARND, AMIKACINPEAK, AMIKACINTROU, AMIKACIN in the last 72 hours.   Microbiology: Recent Results (from the past 720 hour(s))  MRSA PCR Screening     Status: None   Collection Time: 08/18/14 11:15 AM  Result Value Ref Range Status   MRSA by PCR NEGATIVE NEGATIVE Final    Comment:        The GeneXpert MRSA Assay (FDA approved for NASAL specimens only), is one component of a comprehensive MRSA colonization surveillance program. It is not intended to diagnose MRSA infection nor to guide or monitor treatment for MRSA infections.     Anti-infectives    Start     Dose/Rate Route Frequency Ordered Stop   08/19/14 1000  levofloxacin (LEVAQUIN) IVPB 750 mg     750 mg 100 mL/hr over 90 Minutes Intravenous Every 48 hours 08/18/14 1131     08/18/14 0845  levofloxacin (LEVAQUIN) IVPB 500 mg     500 mg 100 mL/hr over 60 Minutes Intravenous  Once 08/18/14 [id number] 08/18/14 [id number]     Assessment: 69 yo M with COPD exacerbation started on Levaquin in  ED. Mild leukocytosis, afebrile. Estimated Normalized CrCl ~ 60  Levaquin 3/7>>  Goal of Therapy:  Eradicate infection.  Plan:  Levaquin 750mg  IV q24h Change to PO once appropriate Monitor renal fxn  Duration of therapy per MD- recommend 5 days  Valrie HartHall, Magdaleno Lortie A 08/19/2014,2:03 PM

## 2014-08-19 NOTE — Progress Notes (Signed)
16Fr foley catheter placed per MD order. 800cc of clear yellow urine returned. Schonewitz, Candelaria StagersLeigh Anne 08/19/2014

## 2014-08-19 NOTE — Consult Note (Signed)
Consult requested by: Dr. Delbert Harness Consult requested for COPD exacerbation/respiratory failure:  HPI: This is a 69 year old who had been in his usual state of fair health at home and to develop increasing problems with cough and shortness of breath over the last month or so. He has tried outpatient antibiotics but did not clear. He eventually came to the emergency department because he was having more severe shortness of breath. He was evaluated in the emergency department and felt to have acute respiratory failure. His PCO2 is slightly elevated. He is requiring oxygen. He has been tachycardic. He says he was diagnosed with COPD some years ago and one time was on oxygen and nebulizer treatment but has been off of all that for some time. He has been able to manage his activities of daily living at home and has been able to play golf.  Past Medical History  Diagnosis Date  . Hypertension   . Hypercholesterolemia   . Hip pain     s/p left hip surgery Nov 4th with Dr. Romeo Apple  . Shortness of breath     exertion  . Arthritis   . Anemia   . Chronic pain     s/p bilateral hip replacement 2015  . Tobacco use   . COPD (chronic obstructive pulmonary disease)     dx with "emphysema" remotely     Family History  Problem Relation Age of Onset  . Arthritis    . Cancer    . Diabetes    . Colon cancer Neg Hx      History   Social History  . Marital Status: Married    Spouse Name: N/A  . Number of Children: N/A  . Years of Education: N/A   Social History Main Topics  . Smoking status: Current Every Day Smoker -- 1.00 packs/day for 50 years    Types: Cigarettes  . Smokeless tobacco: Not on file  . Alcohol Use: No     Comment: No ETOH in past 5 years, was previously an alcoholic.   . Drug Use: No  . Sexual Activity: Yes    Birth Control/ Protection: None   Other Topics Concern  . None   Social History Narrative     ROS: No hemoptysis. No chest pain. He's not had any definite  fever or chills.    Objective: Vital signs in last 24 hours: Temp:  [97.4 F (36.3 C)-99.5 F (37.5 C)] 98.2 F (36.8 C) (03/08 0744) Pulse Rate:  [73-131] 109 (03/08 0800) Resp:  [15-38] 24 (03/08 0800) BP: (94-155)/(35-87) 144/56 mmHg (03/08 0800) SpO2:  [96 %-100 %] 100 % (03/08 0800) Weight:  [52.5 kg (115 lb 11.9 oz)] 52.5 kg (115 lb 11.9 oz) (03/08 0500) Weight change: -1.025 kg (-2 lb 4.1 oz) Last BM Date: 08/15/14  Intake/Output from previous day: 03/07 0701 - 03/08 0700 In: 1757.5 [P.O.:480; I.V.:1277.5] Out: 700 [Urine:700]  PHYSICAL EXAM He is awake and alert. His pupils are reactive. His nose and throat are clear. His neck is supple without masses. His chest shows bilateral rhonchi. His heart is tachycardic without murmur gallop or rub. Abdomen is soft without masses. Extremities showed no edema and central nervous system examination is grossly intact  Lab Results: Basic Metabolic Panel:  Recent Labs  45/40/98 0719 08/19/14 0459  NA 139 138  K 3.7 4.3  CL 100 102  CO2 30 30  GLUCOSE 161* 157*  BUN 16 23  CREATININE 1.23 1.15  CALCIUM 9.5 9.1  Liver Function Tests:  Recent Labs  08/18/14 0719  AST 23  ALT 13  ALKPHOS 57  BILITOT 0.4  PROT 7.5  ALBUMIN 4.3   No results for input(s): LIPASE, AMYLASE in the last 72 hours. No results for input(s): AMMONIA in the last 72 hours. CBC:  Recent Labs  08/18/14 0719 08/19/14 0459  WBC 12.1* 13.4*  NEUTROABS 8.3*  --   HGB 13.0 10.2*  HCT 39.5 30.5*  MCV 95.9 96.2  PLT 359 315   Cardiac Enzymes:  Recent Labs  08/18/14 0719 08/18/14 1344 08/18/14 2137  TROPONINI <0.03 <0.03 <0.03   BNP: No results for input(s): PROBNP in the last 72 hours. D-Dimer: No results for input(s): DDIMER in the last 72 hours. CBG:  Recent Labs  08/18/14 1143 08/18/14 1642 08/18/14 2110  GLUCAP 202* 210* 167*   Hemoglobin A1C:  Recent Labs  08/18/14 0719  HGBA1C 5.9*   Fasting Lipid Panel: No  results for input(s): CHOL, HDL, LDLCALC, TRIG, CHOLHDL, LDLDIRECT in the last 72 hours. Thyroid Function Tests:  Recent Labs  08/18/14 0719  TSH 0.548   Anemia Panel: No results for input(s): VITAMINB12, FOLATE, FERRITIN, TIBC, IRON, RETICCTPCT in the last 72 hours. Coagulation: No results for input(s): LABPROT, INR in the last 72 hours. Urine Drug Screen: Drugs of Abuse  No results found for: LABOPIA, COCAINSCRNUR, LABBENZ, AMPHETMU, THCU, LABBARB  Alcohol Level: No results for input(s): ETH in the last 72 hours. Urinalysis: No results for input(s): COLORURINE, LABSPEC, PHURINE, GLUCOSEU, HGBUR, BILIRUBINUR, KETONESUR, PROTEINUR, UROBILINOGEN, NITRITE, LEUKOCYTESUR in the last 72 hours.  Invalid input(s): APPERANCEUR Misc. Labs:   ABGS:  Recent Labs  08/18/14 1030  PHART 7.353  PO2ART 83.3  TCO2 22.9  HCO3 25.2*     MICROBIOLOGY: Recent Results (from the past 240 hour(s))  MRSA PCR Screening     Status: None   Collection Time: 08/18/14 11:15 AM  Result Value Ref Range Status   MRSA by PCR NEGATIVE NEGATIVE Final    Comment:        The GeneXpert MRSA Assay (FDA approved for NASAL specimens only), is one component of a comprehensive MRSA colonization surveillance program. It is not intended to diagnose MRSA infection nor to guide or monitor treatment for MRSA infections.     Studies/Results: Dg Chest Portable 1 View  08/18/2014   CLINICAL DATA:  Shortness of breath.  Duration:  1 week  EXAM: PORTABLE CHEST - 1 VIEW  COMPARISON:  08/31/2005  FINDINGS: Airway thickening is present, suggesting bronchitis or reactive airways disease. Cardiac and mediastinal margins appear normal. Old healed left rib fractures. No pleural effusion identified.  IMPRESSION: 1. Airway thickening is present, suggesting bronchitis or reactive airways disease.   Electronically Signed   By: Gaylyn Rong M.D.   On: 08/18/2014 07:29    Medications:  Prior to Admission:   Prescriptions prior to admission  Medication Sig Dispense Refill Last Dose  . amLODipine (NORVASC) 10 MG tablet Take 20 mg by mouth daily.    08/17/2014 at Unknown time  . lisinopril (PRINIVIL,ZESTRIL) 10 MG tablet Take 10 mg by mouth daily.    08/17/2014 at Unknown time  . naproxen (NAPROSYN) 500 MG tablet Take 500 mg by mouth 2 (two) times daily with a meal.    08/17/2014 at Unknown time  . oxyCODONE-acetaminophen (PERCOCET) 10-325 MG per tablet Take 1 tablet by mouth every 6 (six) hours as needed.    08/17/2014 at Unknown time  . pravastatin (PRAVACHOL) 40  MG tablet Take 40 mg by mouth daily.    08/17/2014 at Unknown time  . aspirin EC 325 MG EC tablet Take 1 tablet (325 mg total) by mouth daily. (Patient not taking: Reported on 08/18/2014)   Not Taking at Unknown time  . cyclobenzaprine (FLEXERIL) 10 MG tablet Take 1 tablet (10 mg total) by mouth every 8 (eight) hours. (Patient not taking: Reported on 08/18/2014) 60 tablet 1 Not Taking at Unknown time  . Linaclotide (LINZESS) 290 MCG CAPS Take 1 capsule by mouth daily. (Patient not taking: Reported on 08/18/2014) 30 capsule 3 Not Taking at Unknown time  . lubiprostone (AMITIZA) 24 MCG capsule Take 1 capsule (24 mcg total) by mouth 2 (two) times daily with a meal. (Patient not taking: Reported on 08/18/2014) 60 capsule 3 Not Taking at Unknown time  . methocarbamol (ROBAXIN) 500 MG tablet Take 1 tablet (500 mg total) by mouth 4 (four) times daily. (Patient not taking: Reported on 08/18/2014) 56 tablet 2 Not Taking at Unknown time  . nicotine (NICODERM CQ - DOSED IN MG/24 HR) 7 mg/24hr patch Place 1 patch onto the skin daily. (Patient not taking: Reported on 08/18/2014) 28 patch 5 Not Taking at Unknown time  . omeprazole (PRILOSEC) 20 MG capsule 1 po bid 30 minutes before meals (Patient not taking: Reported on 08/18/2014) 62 capsule 11 Not Taking at Unknown time   Scheduled: . enoxaparin (LOVENOX) injection  40 mg Subcutaneous Q24H  . feeding supplement (ENSURE  COMPLETE)  237 mL Oral BID BM  . insulin aspart  0-15 Units Subcutaneous TID WC  . insulin aspart  0-5 Units Subcutaneous QHS  . levalbuterol  0.63 mg Nebulization Q6H  . levofloxacin (LEVAQUIN) IV  750 mg Intravenous Q48H  . methylPREDNISolone (SOLU-MEDROL) injection  60 mg Intravenous Q6H  . naproxen  500 mg Oral BID WC  . nicotine  21 mg Transdermal Daily  . pravastatin  40 mg Oral Daily  . tiotropium  18 mcg Inhalation Daily   Continuous: . sodium chloride 70 mL/hr at 08/19/14 0900   ZOX:[id number]:acetaminophen **OR** acetaminophen, ALPRAZolam, alum & mag hydroxide-simeth, guaiFENesin-dextromethorphan, ondansetron **OR** ondansetron (ZOFRAN) IV, oxyCODONE-acetaminophen **AND** oxyCODONE, traZODone  Assesment: He has acute respiratory failure on the basis of COPD exacerbation. He does not have pneumonia. He appears to be on appropriate treatments at this time and I would continue that. He will need to have lung function testing as an outpatient and I told him this will probably take him 6 weeks or more to get totally over Principal Problem:   Acute respiratory distress Active Problems:   AVN (avascular necrosis of bone)   COPD exacerbation   Tachycardia   Hyperglycemia   Tobacco abuse   Chronic pain   Essential hypertension    Plan: Continue current treatments    LOS: 1 day   Lillah Standre L 08/19/2014, 8:49 AM

## 2014-08-19 NOTE — Progress Notes (Signed)
Inpatient Diabetes Program Recommendations  AACE/ADA: New Consensus Statement on Inpatient Glycemic Control (2013)  Target Ranges:  Prepandial:   less than 140 mg/dL      Peak postprandial:   less than 180 mg/dL (1-2 hours)      Critically ill patients:  140 - 180 mg/dL  Results for [name], Terry R (MRN [id number]) as of 08/19/2014 07:37  Ref. Range 08/18/2014 07:19 08/19/2014 04:59  Hemoglobin A1C Latest Range: 4.8-5.6 % 5.9 (H)   Glucose Latest Range: 70-99 mg/dL 578161 (H) 469157 (H)   Results for [name], Terry R (MRN [id number]) as of 08/19/2014 07:37  Ref. Range 08/18/2014 11:43 08/18/2014 16:42 08/18/2014 21:10  Glucose-Capillary Latest Range: 70-99 mg/dL 244202 (H) 010210 (H) 272167 (H)   Diabetes history: NO Outpatient Diabetes medications: NA Current orders for Inpatient glycemic control: Novolog 0-15 units TID with meals, Novolog 0-5 units HS  Inpatient Diabetes Program Recommendations Insulin - Basal: If steroids are continued, may want to consider ordering low dose basal insulin starting wtih Levemir 5 units daily. Insulin - Meal Coverage: If steroids are continued, please consider ordering Novolog 3 units TID with meals for meal coverage.  Thanks, Orlando PennerMarie Neno Hohensee, RN, MSN, CCRN, CDE Diabetes Coordinator Inpatient Diabetes Program [phone] (Team Pager) [phone] ([facility] office) [phone] [facility] office)

## 2014-08-19 NOTE — Progress Notes (Signed)
Patient and moderate bronchospasm from COPD exacerbation currently controlled on Solu-Medrol IV Levaquin and Xopenex nebulizers Hilda BladesDon R Lore AVW:[id number] DOB: [birth year] DOA: 08/18/2014 PCP: Isabella StallingNDIEGO,Dorian Renfro M, MD             Physical Exam: Blood pressure 118/48, pulse 82, temperature 97.4 F (36.3 C), temperature source Oral, resp. rate 20, height 5\' 11"  (1.803 m), weight 115 lb 11.9 oz (52.5 kg), SpO2 100 %. nectar JVD no carotid bruits no thyromegaly lungs show moderate intrauterine respiratory wheezes scattered coarse rhonchi no rales audible prolonged inspiratory  Bases heart regular rhythm no S3-S4 no heaves thrills rubs   Investigations:  Recent Results (from the past 240 hour(s))  MRSA PCR Screening     Status: None   Collection Time: 08/18/14 11:15 AM  Result Value Ref Range Status   MRSA by PCR NEGATIVE NEGATIVE Final    Comment:        The GeneXpert MRSA Assay (FDA approved for NASAL specimens only), is one component of a comprehensive MRSA colonization surveillance program. It is not intended to diagnose MRSA infection nor to guide or monitor treatment for MRSA infections.      Basic Metabolic Panel:  Recent Labs  82/95/6201/12/26 0719  NA 139  K 3.7  CL 100  CO2 30  GLUCOSE 161*  BUN 16  CREATININE 1.23  CALCIUM 9.5   Liver Function Tests:  Recent Labs  08/18/14 0719  AST 23  ALT 13  ALKPHOS 57  BILITOT 0.4  PROT 7.5  ALBUMIN 4.3     CBC:  Recent Labs  08/18/14 0719  WBC 12.1*  NEUTROABS 8.3*  HGB 13.0  HCT 39.5  MCV 95.9  PLT 359    Dg Chest Portable 1 View  08/18/2014   CLINICAL DATA:  Shortness of breath.  Duration:  1 week  EXAM: PORTABLE CHEST - 1 VIEW  COMPARISON:  08/31/2005  FINDINGS: Airway thickening is present, suggesting bronchitis or reactive airways disease. Cardiac and mediastinal margins appear normal. Old healed left rib fractures. No pleural effusion identified.  IMPRESSION: 1. Airway thickening is present,  suggesting bronchitis or reactive airways disease.   Electronically Signed   By: Gaylyn RongWalter  Liebkemann M.D.   On: 08/18/2014 07:29      Medications:   Impression:  Principal Problem:   Acute respiratory distress Active Problems:   AVN (avascular necrosis of bone)   COPD exacerbation   Tachycardia   Hyperglycemia   Tobacco abuse   Chronic pain   Essential hypertension     Plan: Pulmonary consultation to continue Solu-Medrol Xopenex and IV Levaquin as ordered   Consultants: Pulmonary requested   Procedures   Antibiotics: Levaquin 750 IV daily                  Code Status: Full   Family Communication:    Disposition Plan continue IV Levaquin and Xopenex nebulizers IV Solu-Medrol pulmonary consultation requested  Time spent: 30 minutes   LOS: 1 day   Eathel Pajak M   08/19/2014, 6:18 AM

## 2014-08-20 LAB — GLUCOSE, CAPILLARY
GLUCOSE-CAPILLARY: 148 mg/dL — AB (ref 70–99)
GLUCOSE-CAPILLARY: 127 mg/dL — AB (ref 70–99)
Glucose-Capillary: 139 mg/dL — ABNORMAL HIGH (ref 70–99)
Glucose-Capillary: 182 mg/dL — ABNORMAL HIGH (ref 70–99)
Glucose-Capillary: 145 mg/dL — ABNORMAL HIGH (ref 70–99)

## 2014-08-20 LAB — BASIC METABOLIC PANEL
Anion gap: 4 — ABNORMAL LOW (ref 5–15)
BUN: 29 mg/dL — ABNORMAL HIGH (ref 6–23)
CO2: 29 mmol/L (ref 19–32)
CREATININE: 1.07 mg/dL (ref 0.50–1.35)
Calcium: 8.6 mg/dL (ref 8.4–10.5)
Chloride: 104 mmol/L (ref 96–112)
GFR calc Af Amer: 81 mL/min — ABNORMAL LOW (ref >90)
GFR calc non Af Amer: 70 mL/min — ABNORMAL LOW (ref >90)
Glucose, Bld: 162 mg/dL — ABNORMAL HIGH (ref 70–99)
POTASSIUM: 4 mmol/L (ref 3.5–5.1)
SODIUM: 137 mmol/L (ref 135–145)

## 2014-08-20 NOTE — Care Management Note (Addendum)
    Page 1 of 1   08/22/2014     1:06:12 PM CARE MANAGEMENT NOTE 08/22/2014  Patient:  [name],Zaiah R   Account Number:  [id number]  Date Initiated:  08/20/2014  Documentation initiated by:  Kathyrn SheriffHILDRESS,JESSICA  Subjective/Objective Assessment:   Pt is from home, lives with wife. Pt is independnet at baseline with no HH services or DME's prior to admission. Pt plans to discharge home with self care. Will cont to follow for CM needs.     Action/Plan:   Anticipated DC Date:  08/23/2014   Anticipated DC Plan:  HOME/SELF CARE      DC Planning Services  CM consult      Choice offered to / List presented to:             Status of service:  In process, will continue to follow Medicare Important Message given?  YES (If response is "NO", the following Medicare IM given date fields will be blank) Date Medicare IM given:  08/22/2014 Medicare IM given by:  Sharrie RothmanBLACKWELL,Braysen Cloward C Date Additional Medicare IM given:   Additional Medicare IM given by:    Discharge Disposition:  HOME/SELF CARE  Per UR Regulation:  Reviewed for med. necessity/level of care/duration of stay  If discussed at Long Length of Stay Meetings, dates discussed:    Comments:  08/22/14 1255 Arlyss Queenammy Mindy Behnken, RN BSN CM Md states that pt will be here through the weekend. If pt d/c over the weekend, staff can arrange home O2 or neb machine with agency of pts choice.  08/20/2014 1145 Kathyrn SheriffJessica Childress, RN, MSN, CM

## 2014-08-20 NOTE — Progress Notes (Signed)
COPD exacerbation early on IV Levaquin IV Solu-Medrol and Xopenex significant improvement noticed Hilda BladesDon R Cassell [id number] DOB: 06-Oct-1945 DOA: 08/18/2014 PCP: Isabella StallingNDIEGO,Desira Alessandrini M, MD             Physical Exam: Blood pressure 112/42, pulse 73, temperature 98.1 F (36.7 C), temperature source Oral, resp. rate 17, height 5\' 11"  (1.803 m), weight 116 lb 6.5 oz (52.8 kg), SpO2 97 %. no JVD no carotid bruits lungs mild inspiratory and respiratory wheezes coarse rhonchi no rales appreciable heart regular rhythm no S3 or S4 knee feels rubs   Investigations:  Recent Results (from the past 240 hour(s))  MRSA PCR Screening     Status: None   Collection Time: 08/18/14 11:15 AM  Result Value Ref Range Status   MRSA by PCR NEGATIVE NEGATIVE Final    Comment:        The GeneXpert MRSA Assay (FDA approved for NASAL specimens only), is one component of a comprehensive MRSA colonization surveillance program. It is not intended to diagnose MRSA infection nor to guide or monitor treatment for MRSA infections.      Basic Metabolic Panel:  Recent Labs  36/64/4002/01/26 0459 08/20/14 0443  NA 138 137  K 4.3 4.0  CL 102 104  CO2 30 29  GLUCOSE 157* 162*  BUN 23 29*  CREATININE 1.15 1.07  CALCIUM 9.1 8.6   Liver Function Tests:  Recent Labs  08/18/14 0719  AST 23  ALT 13  ALKPHOS 57  BILITOT 0.4  PROT 7.5  ALBUMIN 4.3     CBC:  Recent Labs  08/18/14 0719 08/19/14 0459  WBC 12.1* 13.4*  NEUTROABS 8.3*  --   HGB 13.0 10.2*  HCT 39.5 30.5*  MCV 95.9 96.2  PLT 359 315    No results found.    Medications:   Impression:  Principal Problem:   Acute respiratory distress Active Problems:   AVN (avascular necrosis of bone)   COPD exacerbation   Tachycardia   Hyperglycemia   Tobacco abuse   Chronic pain   Essential hypertension     Plan: Continue IV/by mouth antibiotics continue Solu-Medrol Xopenex therapy 4 times a day    Consultants: Pulmonology   Procedures   Antibiotics: IV Levaquin                  Code Status: Full  Family Communication:    Disposition Plan continue Levaquin for 5 days duration continue Solu-Medrol and wean as appropriate for pulmonology and Xopenex nebulizer therapy  Time spent: 30 minutes   LOS: 2 days   Preeti Winegardner M   08/20/2014, 7:24 AM

## 2014-08-20 NOTE — Progress Notes (Signed)
Subjective: He says he generally feels better but he is nervous. He is coughing a little bit.  Objective: Vital signs in last 24 hours: Temp:  [97.7 F (36.5 C)-98.2 F (36.8 C)] 98.1 F (36.7 C) (03/09 0400) Pulse Rate:  [73-115] 73 (03/09 0600) Resp:  [14-28] 17 (03/09 0600) BP: (101-144)/(42-108) 112/42 mmHg (03/09 0600) SpO2:  [93 %-100 %] 97 % (03/09 0641) Weight:  [52.8 kg (116 lb 6.5 oz)-53.524 kg (118 lb)] 52.8 kg (116 lb 6.5 oz) (03/09 0500) Weight change: 1.025 kg (2 lb 4.1 oz) Last BM Date: 08/15/14  Intake/Output from previous day: 03/08 0701 - 03/09 0700 In: 2600 [P.O.:840; I.V.:1610; IV Piggyback:150] Out: 1725 [Urine:1725]  PHYSICAL EXAM General appearance: alert, cooperative and mild distress Resp: wheezes bilaterally Cardio: He is still tachycardic but his heart is regular without gallop GI: soft, non-tender; bowel sounds normal; no masses,  no organomegaly Extremities: extremities normal, atraumatic, no cyanosis or edema  Lab Results:  Results for orders placed or performed during the hospital encounter of 08/18/14 (from the past 48 hour(s))  Blood gas, arterial     Status: Abnormal   Collection Time: 08/18/14 10:30 AM  Result Value Ref Range   O2 Content 3.0 L/min   Delivery systems NASAL CANNULA    pH, Arterial 7.353 7.350 - 7.450   pCO2 arterial 46.5 (H) 35.0 - 45.0 mmHg   pO2, Arterial 83.3 80.0 - 100.0 mmHg   Bicarbonate 25.2 (H) 20.0 - 24.0 mEq/L   TCO2 22.9 0 - 100 mmol/L   Acid-Base Excess 0.3 0.0 - 2.0 mmol/L   O2 Saturation 94.7 %   Patient temperature 37.0    Collection site RIGHT BRACHIAL    Drawn by [id number]    Sample type ARTERIAL    Allens test (pass/fail) PASS PASS  MRSA PCR Screening     Status: None   Collection Time: 08/18/14 11:15 AM  Result Value Ref Range   MRSA by PCR NEGATIVE NEGATIVE    Comment:        The GeneXpert MRSA Assay (FDA approved for NASAL specimens only), is one component of a comprehensive MRSA  colonization surveillance program. It is not intended to diagnose MRSA infection nor to guide or monitor treatment for MRSA infections.   Glucose, capillary     Status: Abnormal   Collection Time: 08/18/14 11:43 AM  Result Value Ref Range   Glucose-Capillary 202 (H) 70 - 99 mg/dL  Troponin I     Status: None   Collection Time: 08/18/14  1:44 PM  Result Value Ref Range   Troponin I <0.03 <0.031 ng/mL    Comment:        NO INDICATION OF MYOCARDIAL INJURY.   Glucose, capillary     Status: Abnormal   Collection Time: 08/18/14  4:42 PM  Result Value Ref Range   Glucose-Capillary 210 (H) 70 - 99 mg/dL  Glucose, capillary     Status: Abnormal   Collection Time: 08/18/14  9:10 PM  Result Value Ref Range   Glucose-Capillary 167 (H) 70 - 99 mg/dL  Troponin I     Status: None   Collection Time: 08/18/14  9:37 PM  Result Value Ref Range   Troponin I <0.03 <0.031 ng/mL    Comment:        NO INDICATION OF MYOCARDIAL INJURY.   CBC     Status: Abnormal   Collection Time: 08/19/14  4:59 AM  Result Value Ref Range   WBC 13.4 (H) 4.0 -  10.5 K/uL   RBC 3.17 (L) 4.22 - 5.81 MIL/uL   Hemoglobin 10.2 (L) 13.0 - 17.0 g/dL    Comment: DELTA CHECK NOTED RESULT REPEATED AND VERIFIED    HCT 30.5 (L) 39.0 - 52.0 %   MCV 96.2 78.0 - 100.0 fL   MCH 32.2 26.0 - 34.0 pg   MCHC 33.4 30.0 - 36.0 g/dL   RDW 13.2 11.5 - 15.5 %   Platelets 315 150 - 400 K/uL  Basic metabolic panel     Status: Abnormal   Collection Time: 08/19/14  4:59 AM  Result Value Ref Range   Sodium 138 135 - 145 mmol/L   Potassium 4.3 3.5 - 5.1 mmol/L   Chloride 102 96 - 112 mmol/L   CO2 30 19 - 32 mmol/L   Glucose, Bld 157 (H) 70 - 99 mg/dL   BUN 23 6 - 23 mg/dL   Creatinine, Ser 1.15 0.50 - 1.35 mg/dL   Calcium 9.1 8.4 - 10.5 mg/dL   GFR calc non Af Amer 64 (L) >90 mL/min   GFR calc Af Amer 74 (L) >90 mL/min    Comment: (NOTE) The eGFR has been calculated using the CKD EPI equation. This calculation has not been  validated in all clinical situations. eGFR's persistently <90 mL/min signify possible Chronic Kidney Disease.    Anion gap 6 5 - 15  Glucose, capillary     Status: Abnormal   Collection Time: 08/19/14  7:33 AM  Result Value Ref Range   Glucose-Capillary 144 (H) 70 - 99 mg/dL   Comment 1 Notify RN    Comment 2 Document in Chart   Glucose, capillary     Status: Abnormal   Collection Time: 08/19/14 11:10 AM  Result Value Ref Range   Glucose-Capillary 106 (H) 70 - 99 mg/dL  Basic metabolic panel     Status: Abnormal   Collection Time: 08/20/14  4:43 AM  Result Value Ref Range   Sodium 137 135 - 145 mmol/L   Potassium 4.0 3.5 - 5.1 mmol/L   Chloride 104 96 - 112 mmol/L   CO2 29 19 - 32 mmol/L   Glucose, Bld 162 (H) 70 - 99 mg/dL   BUN 29 (H) 6 - 23 mg/dL   Creatinine, Ser 1.07 0.50 - 1.35 mg/dL   Calcium 8.6 8.4 - 10.5 mg/dL   GFR calc non Af Amer 70 (L) >90 mL/min   GFR calc Af Amer 81 (L) >90 mL/min    Comment: (NOTE) The eGFR has been calculated using the CKD EPI equation. This calculation has not been validated in all clinical situations. eGFR's persistently <90 mL/min signify possible Chronic Kidney Disease.    Anion gap 4 (L) 5 - 15    ABGS  Recent Labs  08/18/14 1030  PHART 7.353  PO2ART 83.3  TCO2 22.9  HCO3 25.2*   CULTURES Recent Results (from the past 240 hour(s))  MRSA PCR Screening     Status: None   Collection Time: 08/18/14 11:15 AM  Result Value Ref Range Status   MRSA by PCR NEGATIVE NEGATIVE Final    Comment:        The GeneXpert MRSA Assay (FDA approved for NASAL specimens only), is one component of a comprehensive MRSA colonization surveillance program. It is not intended to diagnose MRSA infection nor to guide or monitor treatment for MRSA infections.    Studies/Results: No results found.  Medications:  Prior to Admission:  Prescriptions prior to admission  Medication Sig  Dispense Refill Last Dose  . amLODipine (NORVASC) 10 MG  tablet Take 20 mg by mouth daily.    08/17/2014 at Unknown time  . lisinopril (PRINIVIL,ZESTRIL) 10 MG tablet Take 10 mg by mouth daily.    08/17/2014 at Unknown time  . naproxen (NAPROSYN) 500 MG tablet Take 500 mg by mouth 2 (two) times daily with a meal.    08/17/2014 at Unknown time  . oxyCODONE-acetaminophen (PERCOCET) 10-325 MG per tablet Take 1 tablet by mouth every 6 (six) hours as needed.    08/17/2014 at Unknown time  . pravastatin (PRAVACHOL) 40 MG tablet Take 40 mg by mouth daily.    08/17/2014 at Unknown time  . aspirin EC 325 MG EC tablet Take 1 tablet (325 mg total) by mouth daily. (Patient not taking: Reported on 08/18/2014)   Not Taking at Unknown time  . cyclobenzaprine (FLEXERIL) 10 MG tablet Take 1 tablet (10 mg total) by mouth every 8 (eight) hours. (Patient not taking: Reported on 08/18/2014) 60 tablet 1 Not Taking at Unknown time  . Linaclotide (LINZESS) 290 MCG CAPS Take 1 capsule by mouth daily. (Patient not taking: Reported on 08/18/2014) 30 capsule 3 Not Taking at Unknown time  . lubiprostone (AMITIZA) 24 MCG capsule Take 1 capsule (24 mcg total) by mouth 2 (two) times daily with a meal. (Patient not taking: Reported on 08/18/2014) 60 capsule 3 Not Taking at Unknown time  . methocarbamol (ROBAXIN) 500 MG tablet Take 1 tablet (500 mg total) by mouth 4 (four) times daily. (Patient not taking: Reported on 08/18/2014) 56 tablet 2 Not Taking at Unknown time  . nicotine (NICODERM CQ - DOSED IN MG/24 HR) 7 mg/24hr patch Place 1 patch onto the skin daily. (Patient not taking: Reported on 08/18/2014) 28 patch 5 Not Taking at Unknown time  . omeprazole (PRILOSEC) 20 MG capsule 1 po bid 30 minutes before meals (Patient not taking: Reported on 08/18/2014) 62 capsule 11 Not Taking at Unknown time   Scheduled: . enoxaparin (LOVENOX) injection  40 mg Subcutaneous Q24H  . insulin aspart  0-15 Units Subcutaneous TID WC  . insulin aspart  0-5 Units Subcutaneous QHS  . levalbuterol  0.63 mg Nebulization Q6H  .  levofloxacin (LEVAQUIN) IV  750 mg Intravenous Q24H  . methylPREDNISolone (SOLU-MEDROL) injection  60 mg Intravenous Q6H  . naproxen  500 mg Oral BID WC  . nicotine  21 mg Transdermal Daily  . pravastatin  40 mg Oral Daily  . tamsulosin  0.4 mg Oral Daily  . tiotropium  18 mcg Inhalation Daily   Continuous: . sodium chloride 70 mL/hr at 08/20/14 0600   KYH:[id number] **OR** acetaminophen, ALPRAZolam, alum & mag hydroxide-simeth, guaiFENesin-dextromethorphan, ondansetron **OR** ondansetron (ZOFRAN) IV, oxyCODONE-acetaminophen **AND** oxyCODONE, traZODone  Assesment: He was admitted with acute respiratory failure/COPD exacerbation. He is improving. He complains of anxiety probably related to the medications. Principal Problem:   Acute respiratory distress Active Problems:   AVN (avascular necrosis of bone)   COPD exacerbation   Tachycardia   Hyperglycemia   Tobacco abuse   Chronic pain   Essential hypertension    Plan: Continue current treatments. He is on IV steroids antibiotics and on Xopenex. He still has tachycardia probably from medications. He is still wheezing so I would probably hold weaning the steroids until tomorrow. Since I have never seen him before I'm not sure if he gets completely clear or not    LOS: 2 days   Renn Dirocco L 08/20/2014, 7:39 AM

## 2014-08-20 NOTE — Progress Notes (Addendum)
  Chart reviewed and I discussed patient with Dr. Janna Archondiego. Foley placed for retention and draining well on rounds this AM. At least 800 ml in bladder. PSA was sent.   Urinary retention - agree with tamsulosin. PSA might be falsely elevated given recent foley placement.  Rec - 1. Continue tamsulosin 2. Continue foley for at least 3-4 days as bladder was full/stretched when foley placed and he may not be able to void if foley removed too soon. 3. Home with foley and tamsulosin, outpatient follow-up in GU clinic for void trial in 1-2 weeks.  4. If he remains in hospital for several more days OK to attempt void trial if patient's overall conditioning has improved and he is ambulatory. Foley could be d/c'd at midnight morning of discharge so that he can be checked for voiding on AM rounds. Any doubt check a bladder scan.  5. If void trial successful, recommend GU clinic follow-up anyway for H&P/DRE, review PSA, etc.   Call with any questions/concerns.   Doyne KeelMatthew R. Tameah Mihalko, MD [facility] p [phone]

## 2014-08-21 LAB — BASIC METABOLIC PANEL
ANION GAP: 4 — AB (ref 5–15)
BUN: 32 mg/dL — ABNORMAL HIGH (ref 6–23)
CO2: 29 mmol/L (ref 19–32)
Calcium: 8.3 mg/dL — ABNORMAL LOW (ref 8.4–10.5)
Chloride: 105 mmol/L (ref 96–112)
Creatinine, Ser: 0.98 mg/dL (ref 0.50–1.35)
GFR calc non Af Amer: 83 mL/min — ABNORMAL LOW (ref >90)
Glucose, Bld: 133 mg/dL — ABNORMAL HIGH (ref 70–99)
Potassium: 4.1 mmol/L (ref 3.5–5.1)
Sodium: 138 mmol/L (ref 135–145)

## 2014-08-21 LAB — GLUCOSE, CAPILLARY
GLUCOSE-CAPILLARY: 135 mg/dL — AB (ref 70–99)
GLUCOSE-CAPILLARY: 174 mg/dL — AB (ref 70–99)
Glucose-Capillary: 158 mg/dL — ABNORMAL HIGH (ref 70–99)
Glucose-Capillary: 122 mg/dL — ABNORMAL HIGH (ref 70–99)
Glucose-Capillary: 143 mg/dL — ABNORMAL HIGH (ref 70–99)

## 2014-08-21 LAB — PSA: PSA: 9.06 ng/mL — ABNORMAL HIGH (ref <=4.00)

## 2014-08-21 MED ORDER — METHYLPREDNISOLONE SODIUM SUCC 40 MG IJ SOLR
40.0000 mg | Freq: Two times a day (BID) | INTRAMUSCULAR | Status: DC
  Administered 2014-08-21 – 2014-08-22 (×2): 40 mg via INTRAVENOUS
  Filled 2014-08-21 (×2): qty 1

## 2014-08-21 NOTE — Progress Notes (Signed)
Subjective: He says he feels better and has no new complaints. He is having some trouble with urinary retention.  Objective: Vital signs in last 24 hours: Temp:  [97.3 F (36.3 C)-98.2 F (36.8 C)] 97.4 F (36.3 C) (03/10 0748) Pulse Rate:  [72-107] 74 (03/10 0700) Resp:  [19-29] 21 (03/10 0700) BP: (110-147)/(38-62) 125/41 mmHg (03/10 0700) SpO2:  [93 %-100 %] 97 % (03/10 0700) FiO2 (%):  [28 %] 28 % (03/10 0730) Weight:  [52.3 kg (115 lb 4.8 oz)] 52.3 kg (115 lb 4.8 oz) (03/10 0500) Weight change: -1.224 kg (-2 lb 11.2 oz) Last BM Date: 08/15/14  Intake/Output from previous day: 03/09 0701 - 03/10 0700 In: 2120 [P.O.:660; I.V.:1260; IV Piggyback:200] Out: 1425 [Urine:1425]  PHYSICAL EXAM General appearance: alert, cooperative and mild distress Resp: clear to auscultation bilaterally Cardio: regular rate and rhythm, S1, S2 normal, no murmur, click, rub or gallop GI: soft, non-tender; bowel sounds normal; no masses,  no organomegaly Extremities: extremities normal, atraumatic, no cyanosis or edema  Lab Results:  Results for orders placed or performed during the hospital encounter of 08/18/14 (from the past 48 hour(s))  Glucose, capillary     Status: Abnormal   Collection Time: 08/19/14 11:10 AM  Result Value Ref Range   Glucose-Capillary 106 (H) 70 - 99 mg/dL  Glucose, capillary     Status: Abnormal   Collection Time: 08/19/14  4:18 PM  Result Value Ref Range   Glucose-Capillary 148 (H) 70 - 99 mg/dL   Comment 1 Notify RN    Comment 2 Document in Chart   Glucose, capillary     Status: Abnormal   Collection Time: 08/19/14  8:47 PM  Result Value Ref Range   Glucose-Capillary 127 (H) 70 - 99 mg/dL  Basic metabolic panel     Status: Abnormal   Collection Time: 08/20/14  4:43 AM  Result Value Ref Range   Sodium 137 135 - 145 mmol/L   Potassium 4.0 3.5 - 5.1 mmol/L   Chloride 104 96 - 112 mmol/L   CO2 29 19 - 32 mmol/L   Glucose, Bld 162 (H) 70 - 99 mg/dL   BUN 29 (H)  6 - 23 mg/dL   Creatinine, Ser 1.07 0.50 - 1.35 mg/dL   Calcium 8.6 8.4 - 10.5 mg/dL   GFR calc non Af Amer 70 (L) >90 mL/min   GFR calc Af Amer 81 (L) >90 mL/min    Comment: (NOTE) The eGFR has been calculated using the CKD EPI equation. This calculation has not been validated in all clinical situations. eGFR's persistently <90 mL/min signify possible Chronic Kidney Disease.    Anion gap 4 (L) 5 - 15  Glucose, capillary     Status: Abnormal   Collection Time: 08/20/14  7:14 AM  Result Value Ref Range   Glucose-Capillary 139 (H) 70 - 99 mg/dL   Comment 1 Notify RN   PSA     Status: Abnormal   Collection Time: 08/20/14  7:54 AM  Result Value Ref Range   PSA 9.06 (H) <=4.00 ng/mL    Comment: (NOTE) Result repeated and verified. Test Methodology: ECLIA PSA (Electrochemiluminescence Immunoassay) For PSA values from 2.5-4.0, particularly in younger men <1 years old, the AUA and NCCN suggest testing for % Free PSA (3515) and evaluation of the rate of increase in PSA (PSA velocity). Performed at [facility]   Glucose, capillary     Status: Abnormal   Collection Time: 08/20/14 11:09 AM  Result Value Ref  Range   Glucose-Capillary 182 (H) 70 - 99 mg/dL   Comment 1 Notify RN   Glucose, capillary     Status: Abnormal   Collection Time: 08/20/14  4:32 PM  Result Value Ref Range   Glucose-Capillary 145 (H) 70 - 99 mg/dL   Comment 1 Notify RN   Basic metabolic panel     Status: Abnormal   Collection Time: 08/21/14  5:08 AM  Result Value Ref Range   Sodium 138 135 - 145 mmol/L   Potassium 4.1 3.5 - 5.1 mmol/L   Chloride 105 96 - 112 mmol/L   CO2 29 19 - 32 mmol/L   Glucose, Bld 133 (H) 70 - 99 mg/dL   BUN 32 (H) 6 - 23 mg/dL   Creatinine, Ser 0.98 0.50 - 1.35 mg/dL   Calcium 8.3 (L) 8.4 - 10.5 mg/dL   GFR calc non Af Amer 83 (L) >90 mL/min   GFR calc Af Amer >90 >90 mL/min    Comment: (NOTE) The eGFR has been calculated using the CKD EPI equation. This calculation has  not been validated in all clinical situations. eGFR's persistently <90 mL/min signify possible Chronic Kidney Disease.    Anion gap 4 (L) 5 - 15    ABGS  Recent Labs  08/18/14 1030  PHART 7.353  PO2ART 83.3  TCO2 22.9  HCO3 25.2*   CULTURES Recent Results (from the past 240 hour(s))  MRSA PCR Screening     Status: None   Collection Time: 08/18/14 11:15 AM  Result Value Ref Range Status   MRSA by PCR NEGATIVE NEGATIVE Final    Comment:        The GeneXpert MRSA Assay (FDA approved for NASAL specimens only), is one component of a comprehensive MRSA colonization surveillance program. It is not intended to diagnose MRSA infection nor to guide or monitor treatment for MRSA infections.    Studies/Results: No results found.  Medications:  Prior to Admission:  Prescriptions prior to admission  Medication Sig Dispense Refill Last Dose  . amLODipine (NORVASC) 10 MG tablet Take 20 mg by mouth daily.    08/17/2014 at Unknown time  . lisinopril (PRINIVIL,ZESTRIL) 10 MG tablet Take 10 mg by mouth daily.    08/17/2014 at Unknown time  . naproxen (NAPROSYN) 500 MG tablet Take 500 mg by mouth 2 (two) times daily with a meal.    08/17/2014 at Unknown time  . oxyCODONE-acetaminophen (PERCOCET) 10-325 MG per tablet Take 1 tablet by mouth every 6 (six) hours as needed.    08/17/2014 at Unknown time  . pravastatin (PRAVACHOL) 40 MG tablet Take 40 mg by mouth daily.    08/17/2014 at Unknown time  . aspirin EC 325 MG EC tablet Take 1 tablet (325 mg total) by mouth daily. (Patient not taking: Reported on 08/18/2014)   Not Taking at Unknown time  . cyclobenzaprine (FLEXERIL) 10 MG tablet Take 1 tablet (10 mg total) by mouth every 8 (eight) hours. (Patient not taking: Reported on 08/18/2014) 60 tablet 1 Not Taking at Unknown time  . Linaclotide (LINZESS) 290 MCG CAPS Take 1 capsule by mouth daily. (Patient not taking: Reported on 08/18/2014) 30 capsule 3 Not Taking at Unknown time  . lubiprostone (AMITIZA) 24  MCG capsule Take 1 capsule (24 mcg total) by mouth 2 (two) times daily with a meal. (Patient not taking: Reported on 08/18/2014) 60 capsule 3 Not Taking at Unknown time  . methocarbamol (ROBAXIN) 500 MG tablet Take 1 tablet (500 mg total)  by mouth 4 (four) times daily. (Patient not taking: Reported on 08/18/2014) 56 tablet 2 Not Taking at Unknown time  . nicotine (NICODERM CQ - DOSED IN MG/24 HR) 7 mg/24hr patch Place 1 patch onto the skin daily. (Patient not taking: Reported on 08/18/2014) 28 patch 5 Not Taking at Unknown time  . omeprazole (PRILOSEC) 20 MG capsule 1 po bid 30 minutes before meals (Patient not taking: Reported on 08/18/2014) 62 capsule 11 Not Taking at Unknown time   Scheduled: . enoxaparin (LOVENOX) injection  40 mg Subcutaneous Q24H  . insulin aspart  0-15 Units Subcutaneous TID WC  . insulin aspart  0-5 Units Subcutaneous QHS  . levalbuterol  0.63 mg Nebulization Q6H  . levofloxacin (LEVAQUIN) IV  750 mg Intravenous Q24H  . methylPREDNISolone (SOLU-MEDROL) injection  40 mg Intravenous Q12H  . naproxen  500 mg Oral BID WC  . nicotine  21 mg Transdermal Daily  . pravastatin  40 mg Oral Daily  . tamsulosin  0.4 mg Oral Daily  . tiotropium  18 mcg Inhalation Daily   Continuous: . sodium chloride 70 mL/hr at 08/21/14 0701   NBV:APOLIDCVUDTHY **OR** acetaminophen, ALPRAZolam, alum & mag hydroxide-simeth, guaiFENesin-dextromethorphan, ondansetron **OR** ondansetron (ZOFRAN) IV, oxyCODONE-acetaminophen **AND** oxyCODONE, traZODone  Assesment: He was admitted with acute respiratory distress with COPD exacerbation. He is improving and I agree he can move from the ICU Principal Problem:   Acute respiratory distress Active Problems:   AVN (avascular necrosis of bone)   COPD exacerbation   Tachycardia   Hyperglycemia   Tobacco abuse   Chronic pain   Essential hypertension    Plan: Okay to move from ICU. I have reduced his steroids.    LOS: 3 days   Cinzia Devos  L 08/21/2014, 9:00 AM

## 2014-08-21 NOTE — Progress Notes (Signed)
Report was called to nurse on 300 for room 326. Told patient would also be on telemetry and is alert and oriented. Gave all 1000 medication at this time.

## 2014-08-21 NOTE — Progress Notes (Signed)
Patient in less respiratory distress diminishing severity of wheezing auscultated today. Seen by urology for L at obstruction PSA elevated at 9 however this was done after Foley inserted and may be falsely elevated due to local trauma? He suggested leaving Foley in as an outpatient to follow-up currently on Flomax Terry BladesDon R Herman WUJ:[id number] DOB: 1946-05-27 DOA: 08/18/2014 PCP: Terry Herman,Terry Keimig Herman, Herman             Physical Exam: Blood pressure 129/49, pulse 72, temperature 97.3 F (36.3 C), temperature source Axillary, resp. rate 23, height 5\' 11"  (1.803 Herman), weight 115 lb 4.8 oz (52.3 kg), SpO2 99 %. neck no JVD no carotid bruits no thyromegaly lungs moderate inspiratory and expiratory wheezes somewhat improved coarse rhonchi bilaterally diminished breath sounds in the bases no rales auscultated   Investigations:  Recent Results (from the past 240 hour(s))  MRSA PCR Screening     Status: None   Collection Time: 08/18/14 11:15 AM  Result Value Ref Range Status   MRSA by PCR NEGATIVE NEGATIVE Final    Comment:        The GeneXpert MRSA Assay (FDA approved for NASAL specimens only), is one component of a comprehensive MRSA colonization surveillance program. It is not intended to diagnose MRSA infection nor to guide or monitor treatment for MRSA infections.      Basic Metabolic Panel:  Recent Labs  95/62/1302/02/26 0443 08/21/14 0508  NA 137 138  K 4.0 4.1  CL 104 105  CO2 29 29  GLUCOSE 162* 133*  BUN 29* 32*  CREATININE 1.07 0.98  CALCIUM 8.6 8.3*   Liver Function Tests: No results for input(s): AST, ALT, ALKPHOS, BILITOT, PROT, ALBUMIN in the last 72 hours.   CBC:  Recent Labs  08/19/14 0459  WBC 13.4*  HGB 10.2*  HCT 30.5*  MCV 96.2  PLT 315    No results found.    Medications:   Impression: Bladder outlet obstruction elevated PSA  Principal Problem:   Acute respiratory distress Active Problems:   AVN (avascular necrosis of bone)   COPD  exacerbation   Tachycardia   Hyperglycemia   Tobacco abuse   Chronic pain   Essential hypertension     Plan: Transfer to telemetry continue current therapy will wean steroids as per pulmonology  Consultants: Urology and pulmonology   Procedures Foley inserted   Antibiotics: IV Levaquin                  Code Status:   Family Communication:    Disposition Plan continue IV antibiotics and steroids will wean as per pulmonary leave Foley in place to avoid bilateral obstruction will follow up with urology as an outpatient may transfer to telemetry  Time spent: 30 minutes   LOS: 3 days   Terry Herman   08/21/2014, 7:20 AM

## 2014-08-22 LAB — BASIC METABOLIC PANEL
ANION GAP: 5 (ref 5–15)
BUN: 29 mg/dL — AB (ref 6–23)
CO2: 30 mmol/L (ref 19–32)
CREATININE: 1.11 mg/dL (ref 0.50–1.35)
Calcium: 8.5 mg/dL (ref 8.4–10.5)
Chloride: 104 mmol/L (ref 96–112)
GFR, EST AFRICAN AMERICAN: 77 mL/min — AB (ref >90)
GFR, EST NON AFRICAN AMERICAN: 67 mL/min — AB (ref >90)
Glucose, Bld: 112 mg/dL — ABNORMAL HIGH (ref 70–99)
Potassium: 4.5 mmol/L (ref 3.5–5.1)
Sodium: 139 mmol/L (ref 135–145)

## 2014-08-22 LAB — GLUCOSE, CAPILLARY
GLUCOSE-CAPILLARY: 118 mg/dL — AB (ref 70–99)
Glucose-Capillary: 106 mg/dL — ABNORMAL HIGH (ref 70–99)
Glucose-Capillary: 121 mg/dL — ABNORMAL HIGH (ref 70–99)
Glucose-Capillary: 124 mg/dL — ABNORMAL HIGH (ref 70–99)

## 2014-08-22 MED ORDER — LEVOFLOXACIN 750 MG PO TABS
750.0000 mg | ORAL_TABLET | Freq: Every day | ORAL | Status: DC
  Administered 2014-08-22 – 2014-08-24 (×3): 750 mg via ORAL
  Filled 2014-08-22 (×3): qty 1

## 2014-08-22 MED ORDER — PREDNISONE 20 MG PO TABS
40.0000 mg | ORAL_TABLET | Freq: Every day | ORAL | Status: DC
  Administered 2014-08-22 – 2014-08-23 (×2): 40 mg via ORAL
  Filled 2014-08-22 (×4): qty 2

## 2014-08-22 MED ORDER — BUDESONIDE-FORMOTEROL FUMARATE 160-4.5 MCG/ACT IN AERO
2.0000 | INHALATION_SPRAY | Freq: Two times a day (BID) | RESPIRATORY_TRACT | Status: DC
  Administered 2014-08-22 – 2014-08-24 (×4): 2 via RESPIRATORY_TRACT
  Filled 2014-08-22: qty 6

## 2014-08-22 NOTE — Progress Notes (Signed)
Less respiratory distress distress and wheeze today patient alert and oriented Hilda BladesDon R Nofsinger QQV:[id number] DOB: [birth year] DOA: 08/18/2014 PCP: Isabella StallingNDIEGO,Fredricka Kohrs M, MD             Physical Exam: Blood pressure 127/60, pulse 85, temperature 97.6 F (36.4 C), temperature source Oral, resp. rate 21, height 5\' 11"  (1.803 m), weight 115 lb 4.8 oz (52.3 kg), SpO2 97 %. no JVD no carotid bruits no thyromegaly lungs show moderate intrauterine external wheezes scattered coarse rhonchi no rales audible heart regular rhythm no S3-S4 heaves thrills or  rubs   Investigations:  Recent Results (from the past 240 hour(s))  MRSA PCR Screening     Status: None   Collection Time: 08/18/14 11:15 AM  Result Value Ref Range Status   MRSA by PCR NEGATIVE NEGATIVE Final    Comment:        The GeneXpert MRSA Assay (FDA approved for NASAL specimens only), is one component of a comprehensive MRSA colonization surveillance program. It is not intended to diagnose MRSA infection nor to guide or monitor treatment for MRSA infections.      Basic Metabolic Panel:  Recent Labs  33/29/5102/02/26 0443 08/21/14 0508  NA 137 138  K 4.0 4.1  CL 104 105  CO2 29 29  GLUCOSE 162* 133*  BUN 29* 32*  CREATININE 1.07 0.98  CALCIUM 8.6 8.3*   Liver Function Tests: No results for input(s): AST, ALT, ALKPHOS, BILITOT, PROT, ALBUMIN in the last 72 hours.   CBC: No results for input(s): WBC, NEUTROABS, HGB, HCT, MCV, PLT in the last 72 hours.  No results found.    Medications:   Impression:  Principal Problem:   Acute respiratory distress Active Problems:   AVN (avascular necrosis of bone)   COPD exacerbation   Tachycardia   Hyperglycemia   Tobacco abuse   Chronic pain   Essential hypertension     Plan: Solu-Medrol 40 IV every 12 O'Quinn still on board as: 30 for strengthening ambulation continue nebulizer therapy status smoking cessation with patient    Consultants: Pulmonology   Procedures   Antibiotics: Levaquin                  Code Status: Full  Family Communication:    Disposition Plan wean steroids as per pulmonary ambulate patient for strengthening ambulation wean antibiotics as per pulmonary continue nebulizer therapy  Time spent: 30 minutes   LOS: 4 days   Mekhi Lascola M   08/22/2014, 7:16 AM

## 2014-08-22 NOTE — Progress Notes (Signed)
ANTIBIOTIC CONSULT NOTE  Pharmacy Consult for Levaquin Indication: COPD exacerbation  No Known Allergies  Patient Measurements: Height: 5\' 11"  (180.3 cm) Weight: 115 lb 4.8 oz (52.3 kg) IBW/kg (Calculated) : 75.3  Vital Signs: Temp: 97.6 F (36.4 C) (03/11 0605) Temp Source: Oral (03/11 0605) BP: 127/60 mmHg (03/11 0605) Pulse Rate: 85 (03/11 0605) Intake/Output from previous day: 03/10 0701 - 03/11 0700 In: 480 [P.O.:480] Out: 2400 [Urine:2400] Intake/Output from this shift: Total I/O In: 240 [P.O.:240] Out: -   Labs:  Recent Labs  08/20/14 0443 08/21/14 0508 08/22/14 0737  CREATININE 1.07 0.98 1.11   Estimated Creatinine Clearance: 47.8 mL/min (by C-G formula based on Cr of 1.11). No results for input(s): VANCOTROUGH, VANCOPEAK, VANCORANDOM, GENTTROUGH, GENTPEAK, GENTRANDOM, TOBRATROUGH, TOBRAPEAK, TOBRARND, AMIKACINPEAK, AMIKACINTROU, AMIKACIN in the last 72 hours.   Microbiology: Recent Results (from the past 720 hour(s))  MRSA PCR Screening     Status: None   Collection Time: 08/18/14 11:15 AM  Result Value Ref Range Status   MRSA by PCR NEGATIVE NEGATIVE Final    Comment:        The GeneXpert MRSA Assay (FDA approved for NASAL specimens only), is one component of a comprehensive MRSA colonization surveillance program. It is not intended to diagnose MRSA infection nor to guide or monitor treatment for MRSA infections.     Anti-infectives    Start     Dose/Rate Route Frequency Ordered Stop   08/22/14 1200  levofloxacin (LEVAQUIN) tablet 750 mg     750 mg Oral Daily 08/22/14 1156 08/27/14 0959   08/20/14 1000  levofloxacin (LEVAQUIN) IVPB 750 mg  Status:  Discontinued     750 mg 100 mL/hr over 90 Minutes Intravenous Every 24 hours 08/19/14 1634 08/22/14 1156   08/19/14 1800  levofloxacin (LEVAQUIN) IVPB 750 mg  Status:  Discontinued     750 mg 100 mL/hr over 90 Minutes Intravenous Every 24 hours 08/19/14 1405 08/19/14 1634   08/19/14 1000   levofloxacin (LEVAQUIN) IVPB 750 mg  Status:  Discontinued     750 mg 100 mL/hr over 90 Minutes Intravenous Every 48 hours 08/18/14 1131 08/19/14 1405   08/18/14 0845  levofloxacin (LEVAQUIN) IVPB 500 mg     500 mg 100 mL/hr over 60 Minutes Intravenous  Once 08/18/14 0838 08/18/14 0953     Assessment: 69 yo M with COPD exacerbation started on Levaquin in ED. Mild leukocytosis, afebrile. Estimated Normalized CrCl ~ 60  Levaquin 3/7>>  PHARMACIST - PHYSICIAN COMMUNICATION CONCERNING: Antibiotic IV to Oral Route Change Policy  RECOMMENDATION: This patient is receiving Levaquin by the intravenous route.  Based on criteria approved by the Pharmacy and Therapeutics Committee, the antibiotic(s) is/are being converted to the equivalent oral dose form(s).  DESCRIPTION: These criteria include:  Patient being treated for a respiratory tract infection, urinary tract infection, cellulitis or clostridium difficile associated diarrhea if on metronidazole  The patient is not neutropenic and does not exhibit a GI malabsorption state  The patient is eating (either orally or via tube) and/or has been taking other orally administered medications for a least 24 hours  The patient is improving clinically and has a Tmax < 100.5  If you have questions about this conversion, please contact the Pharmacy Department  [x]   [phone]( [phone] )  [facility] []   [phone]( [phone] )  [facility]  []   [phone]( [phone] )  [facility] []   [phone]( [phone] )  [facility]    Nakota Ackert, Louisian[place]ott A 08/22/2014,11:57  AM

## 2014-08-22 NOTE — Evaluation (Signed)
Physical Therapy Evaluation Patient Details Name: Terry BladesDon R Herman MRN: [id number] DOB: 1945-08-27 Today's Date: 08/22/2014   History of Present Illness  This is a 69 year old man with a history of emphysema, who presents with symptomatology consistent with COPD exacerbation. He is a long-time smoker. He was given a number of albuterol nebulizers and IV Solu-Medrol as well as IV Levaquin in the ED. He is currently tachycardic, likely secondary to respiratory distress and bronchodilators. His ABG is reassuring and does not show significant hypoxia or hypercapnia. However, the patient is being emitted to the stepdown unit for closer observation. Hopefully, as his COPD exacerbation is treated, his sinus tachycardia will decrease and/or resolved.  Pt lives independently at home with his wife and reports being active at home.  Clinical Impression  Pt was seen at bedside for evaluation.  He was moderately dyspneic with O2 at 2.5 L  And sat=86%.  He stated that he thought O2 was at 3 L/min.  I elevated it to 3L and O2 sat came to 90%.  He is found to be deconditioned from baseline.  He was able to stand at the edge of bed without assist but became very dyspneic.  O2 sat was 86% and HR elevated to the 130s.  No gait was attempted and he sat in a recliner.  RN alerted.  I anticipate that as his medical distress is resolved his mobility will be adequate to go home at d/c.  He will probably need to use his walker for energy conservation.  He may benefit from cardiopulmonary rehab.    Follow Up Recommendations  (to be determined)    Equipment Recommendations  None recommended by PT    Recommendations for Other Services   none    Precautions / Restrictions Precautions Precautions: Fall Restrictions Weight Bearing Restrictions: No      Mobility  Bed Mobility Overal bed mobility: Modified Independent             General bed mobility comments: transfer is slow  Transfers Overall transfer level:  Needs assistance Equipment used: None Transfers: Sit to/from Stand Sit to Stand: Supervision         General transfer comment: pt able to stand at edge of bed with adequate balance but will probably need to use a walker for energy conservation  Ambulation/Gait Ambulation/Gait assistance:  (gait not done due to elevation of HR into the 130s with standing)              Stairs            Wheelchair Mobility    Modified Rankin (Stroke Patients Only)       Balance Overall balance assessment: No apparent balance deficits (not formally assessed)                                           Pertinent Vitals/Pain Pain Assessment: No/denies pain    Home Living   Living Arrangements: Spouse/significant other Available Help at Discharge: Family;Available 24 hours/day Type of Home: Mobile home Home Access: Stairs to enter Entrance Stairs-Rails: Right;Left;Can reach both Entrance Stairs-Number of Steps: 6 Home Layout: One level Home Equipment: Walker - 2 wheels;Cane - single point;Bedside commode      Prior Function Level of Independence: Independent         Comments: normally very active at home     Hand Dominance   Dominant  Hand: Right    Extremity/Trunk Assessment   Upper Extremity Assessment: Defer to OT evaluation           Lower Extremity Assessment: Generalized weakness         Communication   Communication: No difficulties  Cognition Arousal/Alertness: Awake/alert Behavior During Therapy: WFL for tasks assessed/performed Overall Cognitive Status: Within Functional Limits for tasks assessed                      General Comments      Exercises        Assessment/Plan    PT Assessment Patient needs continued PT services  PT Diagnosis Difficulty walking;Generalized weakness   PT Problem List Decreased strength;Decreased activity tolerance;Decreased mobility;Cardiopulmonary status limiting activity  PT  Treatment Interventions Gait training;Functional mobility training;Therapeutic exercise   PT Goals (Current goals can be found in the Care Plan section) Acute Rehab PT Goals Patient Stated Goal: none stated PT Goal Formulation: With patient Time For Goal Achievement: 09/05/14 Potential to Achieve Goals: Good    Frequency Min 3X/week   Barriers to discharge   none    Co-evaluation               End of Session Equipment Utilized During Treatment: Gait belt;Oxygen Activity Tolerance: Treatment limited secondary to medical complications (Comment) (elevated HR with decreasing O2 sat) Patient left: in chair;with call bell/phone within reach;with nursing/sitter in room Nurse Communication: Mobility status         Time: 2440-1027 PT Time Calculation (min) (ACUTE ONLY): 31 min   Charges:   PT Evaluation $Initial PT Evaluation Tier I: 1 Procedure     PT G CodesMyrlene Broker L 08/22/2014, 10:41 AM

## 2014-08-22 NOTE — Progress Notes (Signed)
Subjective: He says he feels better. He is still weak. He has no other new complaints  Objective: Vital signs in last 24 hours: Temp:  [97.6 F (36.4 C)-98 F (36.7 C)] 97.6 F (36.4 C) (03/11 0605) Pulse Rate:  [85-105] 85 (03/11 0605) Resp:  [20-21] 21 (03/11 0605) BP: (127-137)/(42-60) 127/60 mmHg (03/11 0605) SpO2:  [91 %-97 %] 94 % (03/11 0806) Weight change:  Last BM Date: 08/15/14  Intake/Output from previous day: 03/10 0701 - 03/11 0700 In: 480 [P.O.:480] Out: 2400 [Urine:2400]  PHYSICAL EXAM General appearance: alert, cooperative and no distress Resp: clear to auscultation bilaterally Cardio: regular rate and rhythm, S1, S2 normal, no murmur, click, rub or gallop GI: soft, non-tender; bowel sounds normal; no masses,  no organomegaly Extremities: extremities normal, atraumatic, no cyanosis or edema  Lab Results:  Results for orders placed or performed during the hospital encounter of 08/18/14 (from the past 48 hour(s))  Glucose, capillary     Status: Abnormal   Collection Time: 08/20/14 11:09 AM  Result Value Ref Range   Glucose-Capillary 182 (H) 70 - 99 mg/dL   Comment 1 Notify RN   Glucose, capillary     Status: Abnormal   Collection Time: 08/20/14  4:32 PM  Result Value Ref Range   Glucose-Capillary 145 (H) 70 - 99 mg/dL   Comment 1 Notify RN   Glucose, capillary     Status: Abnormal   Collection Time: 08/20/14  9:18 PM  Result Value Ref Range   Glucose-Capillary 158 (H) 70 - 99 mg/dL   Comment 1 Notify RN   Basic metabolic panel     Status: Abnormal   Collection Time: 08/21/14  5:08 AM  Result Value Ref Range   Sodium 138 135 - 145 mmol/L   Potassium 4.1 3.5 - 5.1 mmol/L   Chloride 105 96 - 112 mmol/L   CO2 29 19 - 32 mmol/L   Glucose, Bld 133 (H) 70 - 99 mg/dL   BUN 32 (H) 6 - 23 mg/dL   Creatinine, Ser 0.98 0.50 - 1.35 mg/dL   Calcium 8.3 (L) 8.4 - 10.5 mg/dL   GFR calc non Af Amer 83 (L) >90 mL/min   GFR calc Af Amer >90 >90 mL/min    Comment:  (NOTE) The eGFR has been calculated using the CKD EPI equation. This calculation has not been validated in all clinical situations. eGFR's persistently <90 mL/min signify possible Chronic Kidney Disease.    Anion gap 4 (L) 5 - 15  Glucose, capillary     Status: Abnormal   Collection Time: 08/21/14  7:41 AM  Result Value Ref Range   Glucose-Capillary 122 (H) 70 - 99 mg/dL   Comment 1 Notify RN   Glucose, capillary     Status: Abnormal   Collection Time: 08/21/14 11:58 AM  Result Value Ref Range   Glucose-Capillary 135 (H) 70 - 99 mg/dL   Comment 1 Notify RN   Glucose, capillary     Status: Abnormal   Collection Time: 08/21/14  5:37 PM  Result Value Ref Range   Glucose-Capillary 174 (H) 70 - 99 mg/dL  Glucose, capillary     Status: Abnormal   Collection Time: 08/21/14  9:54 PM  Result Value Ref Range   Glucose-Capillary 143 (H) 70 - 99 mg/dL  Glucose, capillary     Status: Abnormal   Collection Time: 08/22/14  7:33 AM  Result Value Ref Range   Glucose-Capillary 106 (H) 70 - 99 mg/dL   Comment  1 Notify RN   Basic metabolic panel     Status: Abnormal   Collection Time: 08/22/14  7:37 AM  Result Value Ref Range   Sodium 139 135 - 145 mmol/L   Potassium 4.5 3.5 - 5.1 mmol/L   Chloride 104 96 - 112 mmol/L   CO2 30 19 - 32 mmol/L   Glucose, Bld 112 (H) 70 - 99 mg/dL   BUN 29 (H) 6 - 23 mg/dL   Creatinine, Ser 1.11 0.50 - 1.35 mg/dL   Calcium 8.5 8.4 - 10.5 mg/dL   GFR calc non Af Amer 67 (L) >90 mL/min   GFR calc Af Amer 77 (L) >90 mL/min    Comment: (NOTE) The eGFR has been calculated using the CKD EPI equation. This calculation has not been validated in all clinical situations. eGFR's persistently <90 mL/min signify possible Chronic Kidney Disease.    Anion gap 5 5 - 15    ABGS No results for input(s): PHART, PO2ART, TCO2, HCO3 in the last 72 hours.  Invalid input(s): PCO2 CULTURES Recent Results (from the past 240 hour(s))  MRSA PCR Screening     Status: None    Collection Time: 08/18/14 11:15 AM  Result Value Ref Range Status   MRSA by PCR NEGATIVE NEGATIVE Final    Comment:        The GeneXpert MRSA Assay (FDA approved for NASAL specimens only), is one component of a comprehensive MRSA colonization surveillance program. It is not intended to diagnose MRSA infection nor to guide or monitor treatment for MRSA infections.    Studies/Results: No results found.  Medications:  Prior to Admission:  Prescriptions prior to admission  Medication Sig Dispense Refill Last Dose  . amLODipine (NORVASC) 10 MG tablet Take 20 mg by mouth daily.    08/17/2014 at Unknown time  . lisinopril (PRINIVIL,ZESTRIL) 10 MG tablet Take 10 mg by mouth daily.    08/17/2014 at Unknown time  . naproxen (NAPROSYN) 500 MG tablet Take 500 mg by mouth 2 (two) times daily with a meal.    08/17/2014 at Unknown time  . oxyCODONE-acetaminophen (PERCOCET) 10-325 MG per tablet Take 1 tablet by mouth every 6 (six) hours as needed.    08/17/2014 at Unknown time  . pravastatin (PRAVACHOL) 40 MG tablet Take 40 mg by mouth daily.    08/17/2014 at Unknown time  . aspirin EC 325 MG EC tablet Take 1 tablet (325 mg total) by mouth daily. (Patient not taking: Reported on 08/18/2014)   Not Taking at Unknown time  . cyclobenzaprine (FLEXERIL) 10 MG tablet Take 1 tablet (10 mg total) by mouth every 8 (eight) hours. (Patient not taking: Reported on 08/18/2014) 60 tablet 1 Not Taking at Unknown time  . Linaclotide (LINZESS) 290 MCG CAPS Take 1 capsule by mouth daily. (Patient not taking: Reported on 08/18/2014) 30 capsule 3 Not Taking at Unknown time  . lubiprostone (AMITIZA) 24 MCG capsule Take 1 capsule (24 mcg total) by mouth 2 (two) times daily with a meal. (Patient not taking: Reported on 08/18/2014) 60 capsule 3 Not Taking at Unknown time  . methocarbamol (ROBAXIN) 500 MG tablet Take 1 tablet (500 mg total) by mouth 4 (four) times daily. (Patient not taking: Reported on 08/18/2014) 56 tablet 2 Not Taking at  Unknown time  . nicotine (NICODERM CQ - DOSED IN MG/24 HR) 7 mg/24hr patch Place 1 patch onto the skin daily. (Patient not taking: Reported on 08/18/2014) 28 patch 5 Not Taking at Unknown time  .  omeprazole (PRILOSEC) 20 MG capsule 1 po bid 30 minutes before meals (Patient not taking: Reported on 08/18/2014) 62 capsule 11 Not Taking at Unknown time   Scheduled: . budesonide-formoterol  2 puff Inhalation BID  . enoxaparin (LOVENOX) injection  40 mg Subcutaneous Q24H  . insulin aspart  0-15 Units Subcutaneous TID WC  . insulin aspart  0-5 Units Subcutaneous QHS  . levalbuterol  0.63 mg Nebulization Q6H  . levofloxacin (LEVAQUIN) IV  750 mg Intravenous Q24H  . naproxen  500 mg Oral BID WC  . nicotine  21 mg Transdermal Daily  . pravastatin  40 mg Oral Daily  . predniSONE  40 mg Oral Q breakfast  . tamsulosin  0.4 mg Oral Daily  . tiotropium  18 mcg Inhalation Daily   Continuous: . sodium chloride 1 mL (08/22/14 0018)   FMM:CRFVOHKGOVPCH **OR** acetaminophen, ALPRAZolam, alum & mag hydroxide-simeth, guaiFENesin-dextromethorphan, ondansetron **OR** ondansetron (ZOFRAN) IV, oxyCODONE-acetaminophen **AND** oxyCODONE, traZODone  Assesment: He was admitted with acute respiratory distress. COPD exacerbation. He is much improved. Principal Problem:   Acute respiratory distress Active Problems:   AVN (avascular necrosis of bone)   COPD exacerbation   Tachycardia   Hyperglycemia   Tobacco abuse   Chronic pain   Essential hypertension    Plan: Add Symbicort. I agree with [facility]. Discontinue IV steroids. I will be out of town over the weekend    LOS: 4 days   Medora Roorda L 08/22/2014, 8:43 AM

## 2014-08-22 NOTE — Plan of Care (Signed)
Problem: Phase II Progression Outcomes Goal: Activity at appropriate level-compared to baseline (UP IN CHAIR FOR HEMODIALYSIS)  Outcome: Progressing Out of bed to chair today.     

## 2014-08-23 LAB — GLUCOSE, CAPILLARY
Glucose-Capillary: 88 mg/dL (ref 70–99)
Glucose-Capillary: 95 mg/dL (ref 70–99)
Glucose-Capillary: 135 mg/dL — ABNORMAL HIGH (ref 70–99)
Glucose-Capillary: 219 mg/dL — ABNORMAL HIGH (ref 70–99)

## 2014-08-23 LAB — CBC WITH DIFFERENTIAL/PLATELET
Basophils Absolute: 0 10*3/uL (ref 0.0–0.1)
Basophils Relative: 0 % (ref 0–1)
EOS ABS: 0 10*3/uL (ref 0.0–0.7)
EOS PCT: 0 % (ref 0–5)
HEMATOCRIT: 33.5 % — AB (ref 39.0–52.0)
Hemoglobin: 11.2 g/dL — ABNORMAL LOW (ref 13.0–17.0)
Lymphocytes Relative: 16 % (ref 12–46)
Lymphs Abs: 2.2 10*3/uL (ref 0.7–4.0)
MCH: 32 pg (ref 26.0–34.0)
MCHC: 33.4 g/dL (ref 30.0–36.0)
MCV: 95.7 fL (ref 78.0–100.0)
Monocytes Absolute: 1.5 10*3/uL — ABNORMAL HIGH (ref 0.1–1.0)
Monocytes Relative: 11 % (ref 3–12)
NEUTROS ABS: 10.2 10*3/uL — AB (ref 1.7–7.7)
NEUTROS PCT: 73 % (ref 43–77)
PLATELETS: 336 10*3/uL (ref 150–400)
RBC: 3.5 MIL/uL — AB (ref 4.22–5.81)
RDW: 13.6 % (ref 11.5–15.5)
WBC: 13.9 10*3/uL — AB (ref 4.0–10.5)

## 2014-08-23 LAB — BASIC METABOLIC PANEL
Anion gap: 4 — ABNORMAL LOW (ref 5–15)
BUN: 33 mg/dL — ABNORMAL HIGH (ref 6–23)
CO2: 32 mmol/L (ref 19–32)
CREATININE: 1.01 mg/dL (ref 0.50–1.35)
Calcium: 8.6 mg/dL (ref 8.4–10.5)
Chloride: 102 mmol/L (ref 96–112)
GFR calc Af Amer: 87 mL/min — ABNORMAL LOW (ref >90)
GFR, EST NON AFRICAN AMERICAN: 75 mL/min — AB (ref >90)
GLUCOSE: 101 mg/dL — AB (ref 70–99)
POTASSIUM: 4.1 mmol/L (ref 3.5–5.1)
SODIUM: 138 mmol/L (ref 135–145)

## 2014-08-23 MED ORDER — PREDNISONE 20 MG PO TABS
20.0000 mg | ORAL_TABLET | Freq: Every day | ORAL | Status: DC
  Administered 2014-08-23 – 2014-08-24 (×2): 20 mg via ORAL
  Filled 2014-08-23: qty 1

## 2014-08-23 MED ORDER — VARENICLINE TARTRATE 1 MG PO TABS
0.5000 mg | ORAL_TABLET | Freq: Two times a day (BID) | ORAL | Status: DC
  Administered 2014-08-23 – 2014-08-24 (×3): 0.5 mg via ORAL
  Filled 2014-08-23 (×5): qty 1

## 2014-08-23 NOTE — Plan of Care (Addendum)
IV bad, infiltrated. RN removed. Client does not want another IV placed - states this is his 3rd IV. Dr. Truitt MerleNotified.   Dr. Felecia ShellingFanta returned call and gave verbal ok - DC tele and no re-insert IV at this time.

## 2014-08-23 NOTE — Progress Notes (Signed)
Patient placed on oral steroids today less Chantix to see how he tolerates this lungs seem significantly improved her yesterday Hilda BladesDon R Stringfield ZOX:[id number] DOB: 03-Nov-1945 DOA: 08/18/2014 PCP: Isabella StallingNDIEGO,Sabine Tenenbaum M, MD             Physical Exam: Blood pressure 131/64, pulse 85, temperature 98 F (36.7 C), temperature source Oral, resp. rate 20, height 5\' 11"  (1.803 m), weight 115 lb 4.8 oz (52.3 kg), SpO2 98 %. lungs moderate his return expiratory wheeze scattered coarse rhonchi no rales appreciable heart regular rhythm no S3 or S4 no heaves thrills rubs   Investigations:  Recent Results (from the past 240 hour(s))  MRSA PCR Screening     Status: None   Collection Time: 08/18/14 11:15 AM  Result Value Ref Range Status   MRSA by PCR NEGATIVE NEGATIVE Final    Comment:        The GeneXpert MRSA Assay (FDA approved for NASAL specimens only), is one component of a comprehensive MRSA colonization surveillance program. It is not intended to diagnose MRSA infection nor to guide or monitor treatment for MRSA infections.      Basic Metabolic Panel:  Recent Labs  81/19/1402/03/28 0508 08/22/14 0737  NA 138 139  K 4.1 4.5  CL 105 104  CO2 29 30  GLUCOSE 133* 112*  BUN 32* 29*  CREATININE 0.98 1.11  CALCIUM 8.3* 8.5   Liver Function Tests: No results for input(s): AST, ALT, ALKPHOS, BILITOT, PROT, ALBUMIN in the last 72 hours.   CBC: No results for input(s): WBC, NEUTROABS, HGB, HCT, MCV, PLT in the last 72 hours.  No results found.    Medications:  Impression: Hyperlipidemia  Principal Problem:   Acute respiratory distress Active Problems:   AVN (avascular necrosis of bone)   COPD exacerbation   Tachycardia   Hyperglycemia   Tobacco abuse   Chronic pain   Essential hypertension     Plan: Add Chantix beginning on oral prednisone today monitor progress   Consultants: Pulmonology   Procedures   Antibiotics: Levaquin                  Code Status:    Family Communication:    Disposition Plan add Chantix and/or prednisone 20 mg by mouth daily for transition continues prevent Symbicort  Time spent: 30 minutes   LOS: 5 days   Evanie Buckle M   08/23/2014, 7:12 AM

## 2014-08-24 LAB — GLUCOSE, CAPILLARY
GLUCOSE-CAPILLARY: 111 mg/dL — AB (ref 70–99)
Glucose-Capillary: 135 mg/dL — ABNORMAL HIGH (ref 70–99)

## 2014-08-24 MED ORDER — LEVALBUTEROL HCL 0.63 MG/3ML IN NEBU: 0.6300 mg | INHALATION_SOLUTION | Freq: Four times a day (QID) | RESPIRATORY_TRACT | Status: AC

## 2014-08-24 MED ORDER — VARENICLINE TARTRATE 0.5 MG PO TABS: 1.0000 mg | ORAL_TABLET | Freq: Two times a day (BID) | ORAL | Status: AC

## 2014-08-24 MED ORDER — PREDNISONE 20 MG PO TABS: 20.0000 mg | ORAL_TABLET | Freq: Every day | ORAL | Status: AC

## 2014-08-24 MED ORDER — TAMSULOSIN HCL 0.4 MG PO CAPS: 0.4000 mg | ORAL_CAPSULE | Freq: Every day | ORAL | Status: AC

## 2014-08-24 MED ORDER — BUDESONIDE-FORMOTEROL FUMARATE 160-4.5 MCG/ACT IN AERO: 2.0000 | INHALATION_SPRAY | Freq: Two times a day (BID) | RESPIRATORY_TRACT | Status: AC

## 2014-08-24 NOTE — Progress Notes (Signed)
Patient discharged home.  Son here to pick up.  Medications reviewed with patient.  Encouraged to stop smoking and educated on danger of smoking with O2.  Verbalizes understanding.  Advised to take all meds as prescribed to prevent further COPD exacerbation.  Foley was removed earlier today, patient voided 700cc of pale, clear urine prior to DC.  No questions at this time.  Stable to DC home, assisted off unit with NT assist.

## 2014-08-24 NOTE — Discharge Summary (Signed)
Physician Discharge Summary  Terry Herman ZOX:[id number] DOB: December 20, 1945 DOA: 08/18/2014  PCP: Isabella Stalling, MD  Admit date: 08/18/2014 Discharge date: 08/24/2014   Recommendations for Outpatient Follow-up: Patient will be discharge he is urged strongly to take Chantix as prescribed not smoke any cigarettes and to take all medicines as prescribed Follow-up in the office in 4 days time to assess respiratory progress Discharge Diagnoses:  Principal Problem:   Acute respiratory distress Active Problems:   AVN (avascular necrosis of bone)   COPD exacerbation   Tachycardia   Hyperglycemia   Tobacco abuse   Chronic pain   Essential hypertension   Discharge Condition:   Filed Weights   08/19/14 1258 08/20/14 0500 08/21/14 0500  Weight: 118 lb (53.524 kg) 116 lb 6.5 oz (52.8 kg) 115 lb 4.8 oz (52.3 kg)    History of present illness:  Known hypertension hyperlipidemia severe COPD and BPH limited to the hospital with COPD exacerbation placed on Levaquin empirically on [facility] placed on IV steroids nebulizer therapy seen in consultation by pulmonology patient continued to progress slowly and steroids were weaned to oral steroids and was placed on several lab a steroid combination inhalers he did have urinary retention with bladder obstruction was seen in consultation by urology PSA was elevated at 8 or 9 however this was done after Foley was inserted this may be falsely elevated due to localized trauma  Hospital Course:  Patient continued to improve his IV steroids were weaned he was placed on several combination lobular steroid inhalers as well as oral Chantix and oral steroids he was subsequently discharged fully was removed several hours good voiding follow-up my office in 4 days time  Procedures:  Insertion of Foley catheter  Consultations:  Pulmonology and urology  Discharge Instructions     Medication List    ASK your doctor about these medications        amLODipine 10 MG tablet  Commonly known as:  NORVASC  Take 20 mg by mouth daily.     aspirin 325 MG EC tablet  Take 1 tablet (325 mg total) by mouth daily.     cyclobenzaprine 10 MG tablet  Commonly known as:  FLEXERIL  Take 1 tablet (10 mg total) by mouth every 8 (eight) hours.     Linaclotide 290 MCG Caps capsule  Commonly known as:  LINZESS  Take 1 capsule by mouth daily.     lisinopril 10 MG tablet  Commonly known as:  PRINIVIL,ZESTRIL  Take 10 mg by mouth daily.     lubiprostone 24 MCG capsule  Commonly known as:  AMITIZA  Take 1 capsule (24 mcg total) by mouth 2 (two) times daily with a meal.     methocarbamol 500 MG tablet  Commonly known as:  ROBAXIN  Take 1 tablet (500 mg total) by mouth 4 (four) times daily.     naproxen 500 MG tablet  Commonly known as:  NAPROSYN  Take 500 mg by mouth 2 (two) times daily with a meal.     nicotine 7 mg/24hr patch  Commonly known as:  NICODERM CQ - dosed in mg/24 hr  Place 1 patch onto the skin daily.     omeprazole 20 MG capsule  Commonly known as:  PRILOSEC  1 po bid 30 minutes before meals     oxyCODONE-acetaminophen 10-325 MG per tablet  Commonly known as:  PERCOCET  Take 1 tablet by mouth every 6 (six) hours as needed.  pravastatin 40 MG tablet  Commonly known as:  PRAVACHOL  Take 40 mg by mouth daily.       No Known Allergies    The results of significant diagnostics from this hospitalization (including imaging, microbiology, ancillary and laboratory) are listed below for reference.    Significant Diagnostic Studies: Dg Chest Portable 1 View  08/18/2014   CLINICAL DATA:  Shortness of breath.  Duration:  1 week  EXAM: PORTABLE CHEST - 1 VIEW  COMPARISON:  08/31/2005  FINDINGS: Airway thickening is present, suggesting bronchitis or reactive airways disease. Cardiac and mediastinal margins appear normal. Old healed left rib fractures. No pleural effusion identified.  IMPRESSION: 1. Airway thickening is present,  suggesting bronchitis or reactive airways disease.   Electronically Signed   By: Gaylyn RongWalter  Liebkemann M.D.   On: 08/18/2014 07:29    Microbiology: Recent Results (from the past 240 hour(s))  MRSA PCR Screening     Status: None   Collection Time: 08/18/14 11:15 AM  Result Value Ref Range Status   MRSA by PCR NEGATIVE NEGATIVE Final    Comment:        The GeneXpert MRSA Assay (FDA approved for NASAL specimens only), is one component of a comprehensive MRSA colonization surveillance program. It is not intended to diagnose MRSA infection nor to guide or monitor treatment for MRSA infections.      Labs: Basic Metabolic Panel:  Recent Labs Lab 08/19/14 0459 08/20/14 0443 08/21/14 0508 08/22/14 0737 08/23/14 0644  NA 138 137 138 139 138  K 4.3 4.0 4.1 4.5 4.1  CL 102 104 105 104 102  CO2 30 29 29 30  32  GLUCOSE 157* 162* 133* 112* 101*  BUN 23 29* 32* 29* 33*  CREATININE 1.15 1.07 0.98 1.11 1.01  CALCIUM 9.1 8.6 8.3* 8.5 8.6   Liver Function Tests:  Recent Labs Lab 08/18/14 0719  AST 23  ALT 13  ALKPHOS 57  BILITOT 0.4  PROT 7.5  ALBUMIN 4.3   No results for input(s): LIPASE, AMYLASE in the last 168 hours. No results for input(s): AMMONIA in the last 168 hours. CBC:  Recent Labs Lab 08/18/14 0719 08/19/14 0459 08/23/14 0644  WBC 12.1* 13.4* 13.9*  NEUTROABS 8.3*  --  10.2*  HGB 13.0 10.2* 11.2*  HCT 39.5 30.5* 33.5*  MCV 95.9 96.2 95.7  PLT 359 315 336   Cardiac Enzymes:  Recent Labs Lab 08/18/14 0719 08/18/14 1344 08/18/14 2137  TROPONINI <0.03 <0.03 <0.03   BNP: BNP (last 3 results)  Recent Labs  08/18/14 0719  BNP 24.0    ProBNP (last 3 results) No results for input(s): PROBNP in the last 8760 hours.  CBG:  Recent Labs Lab 08/23/14 0754 08/23/14 1133 08/23/14 1650 08/23/14 2210 08/24/14 0746  GLUCAP 88 95 135* 219* 135*       Signed:  Jewelz Kobus M  [facility] Pager: [phone] 08/24/2014, 11:33 AM

## 2014-08-24 NOTE — Progress Notes (Signed)
Patients O2 sat at rest on RA - 87%, increases to 94% with 2L [place].  On call physician notified and order for home O2 obtained and faxed to [facility].  Foley cath also removed per MD orders.

## 2014-08-25 NOTE — Care Management Utilization Note (Signed)
UR completed 

## 2014-09-12 DEATH — deceased

## 2014-09-18 NOTE — Discharge Summary (Signed)
Physician Discharge Summary  Terry BladesDon R Herman XBM:[id number] DOB: 26-Aug-1945 DOA: 08/18/2014  PCP: Terry StallingNDIEGO,Terry Herman, Terry Herman  Admit date: 08/18/2014 Discharge date: 09/18/2014   Recommendations for Outpatient Follow-up:  Patient is urged to continue his outpatient medicines including Chantix prednisone taper continue nebulizer therapy which she has at home 4 times a day and follow-up my office in 2-3 days time for assessment of clinical status and respiratory insufficiency Discharge Diagnoses:  Principal Problem:   Acute respiratory distress Active Problems:   AVN (avascular necrosis of bone)   COPD exacerbation   Tachycardia   Hyperglycemia   Tobacco abuse   Chronic pain   Essential hypertension   Discharge Condition: Stable to good  Filed Weights   08/19/14 1258 08/20/14 0500 08/21/14 0500  Weight: 118 lb (53.524 kg) 116 lb 6.5 oz (52.8 kg) 115 lb 4.8 oz (52.3 kg)    History of present illness:  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 was admitted with increasing dyspnea has hypercapnic respiratory insufficiency placed on IV Solu-Medrol nebulizer therapy around-the-clock chest x-ray revealed no evidence of infiltrate and clinically did not have pneumonia is treated aggressively with Solu-Medrol for multiple days seen in consultation by pulmonology who felt that therapy was appropriate and did not require antibiotics he was weaned gradually off of steroids and placed on oral prednisone where he was discharged with home healthcare follow-up nebulizer therapy and oxygen therapy follow-up in the office in 2-3 days time  Hospital Course:  See history of present illness  Procedures:    Consultations:  Pulmonology  Discharge Instructions  Discharge Instructions    Discharge instructions    Complete by:  As directed      Discharge patient    Complete by:  As directed             Medication List    STOP taking these medications        cyclobenzaprine 10 MG tablet  Commonly known as:  FLEXERIL     Linaclotide 290 MCG Caps capsule  Commonly known as:  LINZESS     lubiprostone 24 MCG capsule  Commonly known as:  AMITIZA     methocarbamol 500 MG tablet  Commonly known as:  ROBAXIN      TAKE these medications        amLODipine 10 MG tablet  Commonly known as:  NORVASC  Take 20 mg by mouth daily.     aspirin 325 MG EC tablet  Take 1 tablet (325 mg total) by mouth daily.     budesonide-formoterol 160-4.5 MCG/ACT inhaler  Commonly known as:  SYMBICORT  Inhale 2 puffs into the lungs 2 (two) times daily.     levalbuterol 0.63 MG/3ML nebulizer solution  Commonly known as:  XOPENEX  Take 3 mLs (0.63 mg total) by nebulization every 6 (six) hours.     lisinopril 10 MG tablet  Commonly known as:  PRINIVIL,ZESTRIL  Take 10 mg by mouth daily.     naproxen 500 MG tablet  Commonly known as:  NAPROSYN  Take 500 mg by mouth 2 (two) times daily with a meal.     nicotine 7 mg/24hr patch  Commonly known as:  NICODERM CQ - dosed in mg/24 hr  Place 1 patch onto the skin daily.     omeprazole 20 MG capsule  Commonly known as:  PRILOSEC  1 po bid 30 minutes before meals     oxyCODONE-acetaminophen 10-325 MG per tablet  Commonly known as:  PERCOCET  Take 1 tablet by mouth every 6 (six) hours as needed.     pravastatin 40 MG tablet  Commonly known as:  PRAVACHOL  Take 40 mg by mouth daily.     predniSONE 20 MG tablet  Commonly known as:  DELTASONE  Take 1 tablet (20 mg total) by mouth daily with breakfast.     tamsulosin 0.4 MG Caps capsule  Commonly known as:  FLOMAX  Take 1 capsule (0.4 mg total) by mouth daily.     varenicline 0.5 MG tablet  Commonly known as:  CHANTIX  Take 2 tablets (1 mg total) by mouth 2 (two) times daily.       No Known Allergies     Follow-up Information    Follow up with [facility].   Contact information:   [address] [place]  [address] [phone]       Follow up with Terry Herman, Terry Herman On 08/28/2014.   Specialty:  Internal Medicine   Why:  call and make an appointment   Contact information:   [address] [place] [address] [phone]        The results of significant diagnostics from this hospitalization (including imaging, microbiology, ancillary and laboratory) are listed below for reference.    Significant Diagnostic Studies: No results found.  Microbiology: No results found for this or any previous visit (from the past 240 hour(s)).   Labs: Basic Metabolic Panel: No results for input(s): NA, K, CL, CO2, GLUCOSE, BUN, CREATININE, CALCIUM, MG, PHOS in the last 168 hours. Liver Function Tests: No results for input(s): AST, ALT, ALKPHOS, BILITOT, PROT, ALBUMIN in the last 168 hours. No results for input(s): LIPASE, AMYLASE in the last 168 hours. No results for input(s): AMMONIA in the last 168 hours. CBC: No results for input(s): WBC, NEUTROABS, HGB, HCT, MCV, PLT in the last 168 hours. Cardiac Enzymes: No results for input(s): CKTOTAL, CKMB, CKMBINDEX, TROPONINI in the last 168 hours. BNP: BNP (last 3 results)  Recent Labs  08/18/14 0719  BNP 24.0    ProBNP (last 3 results) No results for input(s): PROBNP in the last 8760 hours.  CBG: No results for input(s): GLUCAP in the last 168 hours.     Signed:  Debra Colon Judie Petit  [facility] Pager: [phone] 09/18/2014, 1:34 PM
